# Patient Record
Sex: Male | Born: 1950 | Race: White | Hispanic: No | Marital: Married | Smoking: Never smoker
Health system: Southern US, Community
[De-identification: ages and names within clinical notes are randomized; demographics above are authoritative.]

## PROBLEM LIST (undated history)

## (undated) DIAGNOSIS — G20A1 Parkinson's disease without dyskinesia, without mention of fluctuations: Secondary | ICD-10-CM

## (undated) DIAGNOSIS — J157 Pneumonia due to Mycoplasma pneumoniae: Secondary | ICD-10-CM

## (undated) DIAGNOSIS — K219 Gastro-esophageal reflux disease without esophagitis: Secondary | ICD-10-CM

## (undated) DIAGNOSIS — G20 Parkinson's disease: Secondary | ICD-10-CM

## (undated) DIAGNOSIS — C44319 Basal cell carcinoma of skin of other parts of face: Secondary | ICD-10-CM

## (undated) DIAGNOSIS — M539 Dorsopathy, unspecified: Secondary | ICD-10-CM

## (undated) DIAGNOSIS — U071 COVID-19: Secondary | ICD-10-CM

## (undated) DIAGNOSIS — I209 Angina pectoris, unspecified: Secondary | ICD-10-CM

## (undated) HISTORY — PX: TONSILLECTOMY: SUR1361

## (undated) HISTORY — PX: SHOULDER ARTHROSCOPY: SHX128

---
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
## 2007-02-09 ENCOUNTER — Ambulatory Visit: Payer: Self-pay | Admitting: Gastroenterology

## 2013-01-24 DIAGNOSIS — C44319 Basal cell carcinoma of skin of other parts of face: Secondary | ICD-10-CM

## 2013-01-24 HISTORY — DX: Basal cell carcinoma of skin of other parts of face: C44.319

## 2013-04-16 ENCOUNTER — Ambulatory Visit: Payer: Self-pay | Admitting: Unknown Physician Specialty

## 2013-04-16 LAB — CREATININE, SERUM
Creatinine: 1.12 mg/dL (ref 0.60–1.30)
EGFR (African American): >60

## 2013-07-31 ENCOUNTER — Ambulatory Visit: Payer: Self-pay | Admitting: Orthopedic Surgery

## 2013-08-12 ENCOUNTER — Other Ambulatory Visit: Payer: Self-pay | Admitting: Nurse Practitioner

## 2014-02-25 DIAGNOSIS — E785 Hyperlipidemia, unspecified: Secondary | ICD-10-CM | POA: Insufficient documentation

## 2014-04-25 DIAGNOSIS — M7542 Impingement syndrome of left shoulder: Secondary | ICD-10-CM | POA: Insufficient documentation

## 2014-08-23 ENCOUNTER — Observation Stay (HOSPITAL_COMMUNITY)
Admission: EM | Admit: 2014-08-23 | Discharge: 2014-08-24 | Disposition: A | Discharge status: Home / Self Care | Payer: BC Managed Care – PPO | Attending: Internal Medicine | Admitting: Internal Medicine

## 2014-08-23 ENCOUNTER — Emergency Department (HOSPITAL_COMMUNITY): Payer: BC Managed Care – PPO

## 2014-08-23 ENCOUNTER — Encounter (HOSPITAL_COMMUNITY): Payer: Self-pay

## 2014-08-23 DIAGNOSIS — Z7982 Long term (current) use of aspirin: Secondary | ICD-10-CM | POA: Insufficient documentation

## 2014-08-23 DIAGNOSIS — J9811 Atelectasis: Secondary | ICD-10-CM | POA: Diagnosis not present

## 2014-08-23 DIAGNOSIS — G8918 Other acute postprocedural pain: Secondary | ICD-10-CM

## 2014-08-23 DIAGNOSIS — R5082 Postprocedural fever: Secondary | ICD-10-CM | POA: Diagnosis not present

## 2014-08-23 DIAGNOSIS — K219 Gastro-esophageal reflux disease without esophagitis: Secondary | ICD-10-CM | POA: Diagnosis present

## 2014-08-23 DIAGNOSIS — M25512 Pain in left shoulder: Secondary | ICD-10-CM | POA: Insufficient documentation

## 2014-08-23 DIAGNOSIS — R509 Fever, unspecified: Secondary | ICD-10-CM

## 2014-08-23 DIAGNOSIS — E871 Hypo-osmolality and hyponatremia: Secondary | ICD-10-CM | POA: Diagnosis present

## 2014-08-23 DIAGNOSIS — R0602 Shortness of breath: Secondary | ICD-10-CM

## 2014-08-23 DIAGNOSIS — K5900 Constipation, unspecified: Secondary | ICD-10-CM | POA: Diagnosis not present

## 2014-08-23 DIAGNOSIS — R079 Chest pain, unspecified: Secondary | ICD-10-CM | POA: Insufficient documentation

## 2014-08-23 DIAGNOSIS — J90 Pleural effusion, not elsewhere classified: Secondary | ICD-10-CM

## 2014-08-23 DIAGNOSIS — Z9889 Other specified postprocedural states: Secondary | ICD-10-CM

## 2014-08-23 LAB — COMPREHENSIVE METABOLIC PANEL
ALT: 16 U/L (ref 0–53)
AST: 20 U/L (ref 0–37)
Albumin: 3.5 g/dL (ref 3.5–5.2)
Alkaline Phosphatase: 76 U/L (ref 39–117)
Anion gap: 16 — ABNORMAL HIGH (ref 5–15)
BUN: 12 mg/dL (ref 6–23)
CHLORIDE: 88 meq/L — AB (ref 96–112)
CO2: 23 meq/L (ref 19–32)
CREATININE: 0.99 mg/dL (ref 0.50–1.35)
Calcium: 8.9 mg/dL (ref 8.4–10.5)
GFR calc Af Amer: >90 mL/min (ref >90)
GFR, EST NON AFRICAN AMERICAN: 85 mL/min — AB (ref >90)
Glucose, Bld: 135 mg/dL — ABNORMAL HIGH (ref 70–99)
Potassium: 3.7 mEq/L (ref 3.7–5.3)
Sodium: 127 mEq/L — ABNORMAL LOW (ref 137–147)
Total Bilirubin: 0.9 mg/dL (ref 0.3–1.2)
Total Protein: 6.8 g/dL (ref 6.0–8.3)

## 2014-08-23 LAB — CBC WITH DIFFERENTIAL/PLATELET
BASOS ABS: 0 10*3/uL (ref 0.0–0.1)
Basophils Relative: 0 % (ref 0–1)
Eosinophils Absolute: 0 10*3/uL (ref 0.0–0.7)
Eosinophils Relative: 0 % (ref 0–5)
HEMATOCRIT: 42.5 % (ref 39.0–52.0)
HEMOGLOBIN: 14.7 g/dL (ref 13.0–17.0)
Lymphocytes Relative: 6 % — ABNORMAL LOW (ref 12–46)
Lymphs Abs: 0.7 10*3/uL (ref 0.7–4.0)
MCH: 31.7 pg (ref 26.0–34.0)
MCHC: 34.6 g/dL (ref 30.0–36.0)
MCV: 91.8 fL (ref 78.0–100.0)
MONO ABS: 0.9 10*3/uL (ref 0.1–1.0)
MONOS PCT: 7 % (ref 3–12)
NEUTROS ABS: 11.1 10*3/uL — AB (ref 1.7–7.7)
Neutrophils Relative %: 87 % — ABNORMAL HIGH (ref 43–77)
Platelets: 250 10*3/uL (ref 150–400)
RBC: 4.63 MIL/uL (ref 4.22–5.81)
RDW: 13.3 % (ref 11.5–15.5)
WBC: 12.8 10*3/uL — AB (ref 4.0–10.5)

## 2014-08-23 LAB — I-STAT TROPONIN, ED: Troponin i, poc: 0.01 ng/mL (ref 0.00–0.08)

## 2014-08-23 MED ORDER — SODIUM CHLORIDE 0.9 % IV SOLN
INTRAVENOUS | Status: DC
  Administered 2014-08-24: 01:00:00 via INTRAVENOUS

## 2014-08-23 NOTE — ED Notes (Addendum)
Pt. Had left shoulder surgery x2 days ago. Reports increase in pain with chills and fevers up to 103. Numbness to fingers. Radial pulse intact. Pt. Also reports increased SOB.

## 2014-08-24 ENCOUNTER — Encounter (HOSPITAL_COMMUNITY): Payer: Self-pay

## 2014-08-24 ENCOUNTER — Emergency Department (HOSPITAL_COMMUNITY): Payer: BC Managed Care – PPO

## 2014-08-24 DIAGNOSIS — Z9889 Other specified postprocedural states: Secondary | ICD-10-CM

## 2014-08-24 DIAGNOSIS — E871 Hypo-osmolality and hyponatremia: Secondary | ICD-10-CM | POA: Diagnosis present

## 2014-08-24 DIAGNOSIS — J9811 Atelectasis: Secondary | ICD-10-CM | POA: Diagnosis present

## 2014-08-24 DIAGNOSIS — R079 Chest pain, unspecified: Secondary | ICD-10-CM | POA: Diagnosis present

## 2014-08-24 DIAGNOSIS — R5082 Postprocedural fever: Secondary | ICD-10-CM | POA: Diagnosis present

## 2014-08-24 DIAGNOSIS — K219 Gastro-esophageal reflux disease without esophagitis: Secondary | ICD-10-CM

## 2014-08-24 DIAGNOSIS — K5900 Constipation, unspecified: Secondary | ICD-10-CM | POA: Diagnosis present

## 2014-08-24 LAB — URINALYSIS, ROUTINE W REFLEX MICROSCOPIC
Bilirubin Urine: NEGATIVE
GLUCOSE, UA: NEGATIVE mg/dL
HGB URINE DIPSTICK: NEGATIVE
Ketones, ur: NEGATIVE mg/dL
Leukocytes, UA: NEGATIVE
Nitrite: NEGATIVE
PROTEIN: NEGATIVE mg/dL
Specific Gravity, Urine: 1.008 (ref 1.005–1.030)
Urobilinogen, UA: 0.2 mg/dL (ref 0.0–1.0)
pH: 8.5 — ABNORMAL HIGH (ref 5.0–8.0)

## 2014-08-24 LAB — COMPREHENSIVE METABOLIC PANEL
ALK PHOS: 63 U/L (ref 39–117)
ALT: 15 U/L (ref 0–53)
AST: 17 U/L (ref 0–37)
Albumin: 3 g/dL — ABNORMAL LOW (ref 3.5–5.2)
Anion gap: 15 (ref 5–15)
BUN: 11 mg/dL (ref 6–23)
CO2: 25 mEq/L (ref 19–32)
Calcium: 8.6 mg/dL (ref 8.4–10.5)
Chloride: 94 mEq/L — ABNORMAL LOW (ref 96–112)
Creatinine, Ser: 0.9 mg/dL (ref 0.50–1.35)
GFR calc Af Amer: >90 mL/min (ref >90)
GFR, EST NON AFRICAN AMERICAN: 88 mL/min — AB (ref >90)
Glucose, Bld: 118 mg/dL — ABNORMAL HIGH (ref 70–99)
POTASSIUM: 3.8 meq/L (ref 3.7–5.3)
SODIUM: 134 meq/L — AB (ref 137–147)
Total Bilirubin: 0.7 mg/dL (ref 0.3–1.2)
Total Protein: 5.9 g/dL — ABNORMAL LOW (ref 6.0–8.3)

## 2014-08-24 LAB — CBC WITH DIFFERENTIAL/PLATELET
BASOS ABS: 0 10*3/uL (ref 0.0–0.1)
Basophils Relative: 0 % (ref 0–1)
Eosinophils Absolute: 0 10*3/uL (ref 0.0–0.7)
Eosinophils Relative: 1 % (ref 0–5)
HCT: 39.4 % (ref 39.0–52.0)
Hemoglobin: 13.6 g/dL (ref 13.0–17.0)
LYMPHS ABS: 1.7 10*3/uL (ref 0.7–4.0)
LYMPHS PCT: 22 % (ref 12–46)
MCH: 31.8 pg (ref 26.0–34.0)
MCHC: 34.5 g/dL (ref 30.0–36.0)
MCV: 92.1 fL (ref 78.0–100.0)
Monocytes Absolute: 0.8 10*3/uL (ref 0.1–1.0)
Monocytes Relative: 10 % (ref 3–12)
NEUTROS ABS: 5 10*3/uL (ref 1.7–7.7)
NEUTROS PCT: 67 % (ref 43–77)
Platelets: 227 10*3/uL (ref 150–400)
RBC: 4.28 MIL/uL (ref 4.22–5.81)
RDW: 13.4 % (ref 11.5–15.5)
WBC: 7.5 10*3/uL (ref 4.0–10.5)

## 2014-08-24 LAB — LACTIC ACID, PLASMA: Lactic Acid, Venous: 1.1 mmol/L (ref 0.5–2.2)

## 2014-08-24 LAB — TSH: TSH: 5.78 u[IU]/mL — ABNORMAL HIGH (ref 0.350–4.500)

## 2014-08-24 LAB — TROPONIN I: Troponin I: <0.3 ng/mL (ref <0.30)

## 2014-08-24 MED ORDER — ONDANSETRON HCL 4 MG/2ML IJ SOLN: 4.0000 mg | Freq: Four times a day (QID) | INTRAMUSCULAR | Status: DC | PRN

## 2014-08-24 MED ORDER — OXYCODONE-ACETAMINOPHEN 5-325 MG PO TABS
1.0000 | ORAL_TABLET | ORAL | Status: DC | PRN
  Administered 2014-08-24 (×2): 2 via ORAL
  Filled 2014-08-24 (×2): qty 2

## 2014-08-24 MED ORDER — HYDROMORPHONE HCL 1 MG/ML IJ SOLN
1.0000 mg | INTRAMUSCULAR | Status: DC | PRN
Start: 2014-08-24 — End: 2014-08-24
  Administered 2014-08-24: 1 mg via INTRAVENOUS
  Filled 2014-08-24: qty 1

## 2014-08-24 MED ORDER — DOCUSATE SODIUM 100 MG PO CAPS
200.0000 mg | ORAL_CAPSULE | Freq: Two times a day (BID) | ORAL | Status: DC
  Administered 2014-08-24: 200 mg via ORAL
  Filled 2014-08-24: qty 2

## 2014-08-24 MED ORDER — SODIUM CHLORIDE 0.9 % IV SOLN: INTRAVENOUS | Status: DC

## 2014-08-24 MED ORDER — ACETAMINOPHEN 650 MG RE SUPP: 650.0000 mg | Freq: Four times a day (QID) | RECTAL | Status: DC | PRN

## 2014-08-24 MED ORDER — ACETAMINOPHEN 325 MG PO TABS: 650.0000 mg | ORAL_TABLET | Freq: Four times a day (QID) | ORAL | Status: DC | PRN

## 2014-08-24 MED ORDER — ASPIRIN EC 81 MG PO TBEC
81.0000 mg | DELAYED_RELEASE_TABLET | Freq: Every day | ORAL | Status: DC
  Administered 2014-08-24: 81 mg via ORAL
  Filled 2014-08-24: qty 1

## 2014-08-24 MED ORDER — HYDROMORPHONE HCL 1 MG/ML IJ SOLN: 0.5000 mg | INTRAMUSCULAR | Status: DC | PRN

## 2014-08-24 MED ORDER — ONDANSETRON HCL 4 MG PO TABS: 4.0000 mg | ORAL_TABLET | Freq: Four times a day (QID) | ORAL | Status: DC | PRN

## 2014-08-24 MED ORDER — IOHEXOL 350 MG/ML SOLN
100.0000 mL | Freq: Once | INTRAVENOUS | Status: AC | PRN
  Administered 2014-08-24: 100 mL via INTRAVENOUS

## 2014-08-24 MED ORDER — SODIUM CHLORIDE 0.9 % IJ SOLN
3.0000 mL | Freq: Two times a day (BID) | INTRAMUSCULAR | Status: DC
Start: 2014-08-24 — End: 2014-08-24
  Administered 2014-08-24: 3 mL via INTRAVENOUS

## 2014-08-24 MED ORDER — OXYCODONE-ACETAMINOPHEN 5-325 MG PO TABS
1.0000 | ORAL_TABLET | ORAL | Status: DC | PRN
Start: 2014-08-24 — End: 2016-05-15

## 2014-08-24 MED ORDER — SODIUM CHLORIDE 0.9 % IV SOLN
INTRAVENOUS | Status: DC
  Administered 2014-08-24: 07:00:00 via INTRAVENOUS

## 2014-08-24 MED ORDER — FENTANYL CITRATE 0.05 MG/ML IJ SOLN
50.0000 ug | INTRAMUSCULAR | Status: DC | PRN
  Administered 2014-08-24: 50 ug via INTRAVENOUS
  Filled 2014-08-24 (×2): qty 2

## 2014-08-24 MED ORDER — OXYCODONE HCL ER 10 MG PO T12A: 20.0000 mg | EXTENDED_RELEASE_TABLET | Freq: Two times a day (BID) | ORAL | Status: DC | PRN

## 2014-08-24 MED ORDER — PANTOPRAZOLE SODIUM 40 MG PO TBEC
40.0000 mg | DELAYED_RELEASE_TABLET | Freq: Every day | ORAL | Status: DC
  Administered 2014-08-24: 40 mg via ORAL
  Filled 2014-08-24: qty 1

## 2014-08-24 MED ORDER — ENOXAPARIN SODIUM 40 MG/0.4ML ~~LOC~~ SOLN
40.0000 mg | Freq: Every day | SUBCUTANEOUS | Status: DC
Start: 2014-08-24 — End: 2014-08-24
  Administered 2014-08-24: 40 mg via SUBCUTANEOUS
  Filled 2014-08-24: qty 0.4

## 2014-08-24 NOTE — ED Provider Notes (Signed)
CSN: [id number]     Arrival date & time 08/23/14  2158 History   First MD Initiated Contact with Patient 08/23/14 2304     Chief Complaint  Patient presents with  . Post-op Problem      HPI  Pt was seen at 2340. Per pt and his family, c/o gradual onset and worsening of persistent home fevers to "103" for the past 2 days. Pt states his fevers started s/p left shoulder arthroscopic surgery 2 days ago. Pt also c/o increasing and uncontrolled pain in his left shoulder, as well as left sided CP and SOB. Denies palpitations, no cough, no abd pain, no N/V/D, no back pain, no rash, no injury.    [facility] Dr. Vonna Drafts [facility], [place]) History reviewed. No pertinent past medical history.   Past Surgical History  Procedure Laterality Date  . Shoulder arthroscopy    . Tonsillectomy      History  Substance Use Topics  . Smoking status: Never Smoker   . Smokeless tobacco: Not on file  . Alcohol Use: Yes     Comment: occasionally    Review of Systems ROS: Statement: All systems negative except as marked or noted in the HPI; Constitutional: +fever and chills. ; ; Eyes: Negative for eye pain, redness and discharge. ; ; ENMT: Negative for ear pain, hoarseness, nasal congestion, sinus pressure and sore throat. ; ; Cardiovascular: +CP, SOB. Negative for palpitations, diaphoresis, and peripheral edema. ; ; Respiratory: Negative for cough, wheezing and stridor. ; ; Gastrointestinal: Negative for nausea, vomiting, diarrhea, abdominal pain, blood in stool, hematemesis, jaundice and rectal bleeding. . ; ; Genitourinary: Negative for dysuria, flank pain and hematuria. ; ; Musculoskeletal: +shoulder pain. Negative for back pain and neck pain. Negative for swelling and trauma.; ; Skin: Negative for pruritus, rash, abrasions, blisters, bruising and skin lesion.; ; Neuro: Negative for headache, lightheadedness and neck stiffness. Negative for weakness, altered level of consciousness , altered mental status,  extremity weakness, paresthesias, involuntary movement, seizure and syncope.        Allergies  Review of patient's allergies indicates no known allergies.  Home Medications   Prior to Admission medications   Medication Sig Start Date End Date Taking? Authorizing Provider  aspirin EC 81 MG tablet Take 81 mg by mouth daily.   Yes Historical Provider, MD  azelastine (ASTELIN) 0.1 % nasal spray Place 1-2 sprays into both nostrils daily as needed for rhinitis or allergies. Use in each nostril as directed   Yes Historical Provider, MD  azelastine (OPTIVAR) 0.05 % ophthalmic solution Place 1 drop into both eyes daily as needed (allergy).   Yes Historical Provider, MD  bisacodyl (DULCOLAX) 5 MG EC tablet Take 15 mg by mouth daily as needed for moderate constipation.   Yes Historical Provider, MD  docusate sodium (COLACE) 100 MG capsule Take 200 mg by mouth daily as needed for mild constipation.   Yes Historical Provider, MD  omeprazole (PRILOSEC) 40 MG capsule Take 40 mg by mouth daily.   Yes Historical Provider, MD  ondansetron (ZOFRAN) 4 MG tablet Take 4 mg by mouth every 6 (six) hours as needed for nausea or vomiting.   Yes Historical Provider, MD  OxyCODONE (OXYCONTIN) 20 mg T12A 12 hr tablet Take 20 mg by mouth every 12 (twelve) hours as needed (pain).   Yes Historical Provider, MD  OxyCODONE HCl, Abuse Deter, 5 MG TABA Take 1-3 tablets by mouth every 3 (three) hours as needed (pain).   Yes Historical Provider,  MD  TESTOSTERONE TD Place 1 application onto the skin daily.   Yes Historical Provider, MD  triamcinolone (NASACORT ALLERGY 24HR) 55 MCG/ACT AERO nasal inhaler Place 2 sprays into the nose daily as needed (allergies).   Yes Historical Provider, MD   BP 108/72 mmHg  Pulse 81  Temp(Src) 98.9 F (37.2 C) (Oral)  Resp 27  SpO2 94% Physical Exam  2345: Physical examination:  Nursing notes reviewed; Vital signs and O2 SAT reviewed;  Constitutional: Well developed, Well nourished, Well  hydrated, In no acute distress; Head:  Normocephalic, atraumatic; Eyes: EOMI, PERRL, No scleral icterus; ENMT: Mouth and pharynx normal, Mucous membranes moist; Neck: Supple, Full range of motion, No lymphadenopathy; Cardiovascular: Regular rate and rhythm, No gallop; Respiratory: Breath sounds coarse & equal bilaterally, No wheezes.  Speaking full sentences with ease, Normal respiratory effort/excursion; Chest: +left upper chest wall tender to palp, no erythema, no ecchymosis. Movement normal; Abdomen: Soft, Nontender, Nondistended, Normal bowel sounds; Genitourinary: No CVA tenderness; Extremities: Pulses normal, +left shoulder with generalized tenderness to palp. No erythema, no ecchymosis, no drainage from surgical wounds. Steri-strips in place, dry/intact. No deformity. Strong radial pulse, brisk cap refill in fingertips. No calf edema or asymmetry.; Neuro: AA&Ox3, Major CN grossly intact.  Speech clear. No gross focal motor or sensory deficits in extremities.; Skin: Color normal, Warm, Dry.   ED Course  Procedures    EKG Interpretation   Date/Time:  Saturday August 23 2014 22:36:10 EST Ventricular Rate:  95 PR Interval:  165 QRS Duration: 89 QT Interval:  339 QTC Calculation: 426 R Axis:   70 Text Interpretation:  Sinus rhythm Probable left atrial enlargement  Baseline wander No old tracing to compare Confirmed by [facility]  MD,  Nunzio Cory [id number]) on 08/23/2014 11:37:32 PM      MDM  MDM Reviewed: nursing note and vitals Interpretation: labs, x-ray, CT scan and ECG Total time providing critical care: 30-74 minutes. This excludes time spent performing separately reportable procedures and services. Consults: admitting MD   CRITICAL CARE Performed by: Alfonzo Feller Total critical care time: 35 Critical care time was exclusive of separately billable procedures and treating other patients. Critical care was necessary to treat or prevent imminent or life-threatening  deterioration. Critical care was time spent personally by me on the following activities: development of treatment plan with patient and/or surrogate as well as nursing, discussions with consultants, evaluation of patient's response to treatment, examination of patient, obtaining history from patient or surrogate, ordering and performing treatments and interventions, ordering and review of laboratory studies, ordering and review of radiographic studies, pulse oximetry and re-evaluation of patient's condition.    Results for orders placed or performed during the hospital encounter of 08/23/14  CBC WITH DIFFERENTIAL  Result Value Ref Range   WBC 12.8 (H) 4.0 - 10.5 K/uL   RBC 4.63 4.22 - 5.81 MIL/uL   Hemoglobin 14.7 13.0 - 17.0 g/dL   HCT 42.5 39.0 - 52.0 %   MCV 91.8 78.0 - 100.0 fL   MCH 31.7 26.0 - 34.0 pg   MCHC 34.6 30.0 - 36.0 g/dL   RDW 13.3 11.5 - 15.5 %   Platelets 250 150 - 400 K/uL   Neutrophils Relative % 87 (H) 43 - 77 %   Neutro Abs 11.1 (H) 1.7 - 7.7 K/uL   Lymphocytes Relative 6 (L) 12 - 46 %   Lymphs Abs 0.7 0.7 - 4.0 K/uL   Monocytes Relative 7 3 - 12 %   Monocytes Absolute 0.9  0.1 - 1.0 K/uL   Eosinophils Relative 0 0 - 5 %   Eosinophils Absolute 0.0 0.0 - 0.7 K/uL   Basophils Relative 0 0 - 1 %   Basophils Absolute 0.0 0.0 - 0.1 K/uL  Comprehensive metabolic panel  Result Value Ref Range   Sodium 127 (L) 137 - 147 mEq/L   Potassium 3.7 3.7 - 5.3 mEq/L   Chloride 88 (L) 96 - 112 mEq/L   CO2 23 19 - 32 mEq/L   Glucose, Bld 135 (H) 70 - 99 mg/dL   BUN 12 6 - 23 mg/dL   Creatinine, Ser 0.99 0.50 - 1.35 mg/dL   Calcium 8.9 8.4 - 10.5 mg/dL   Total Protein 6.8 6.0 - 8.3 g/dL   Albumin 3.5 3.5 - 5.2 g/dL   AST 20 0 - 37 U/L   ALT 16 0 - 53 U/L   Alkaline Phosphatase 76 39 - 117 U/L   Total Bilirubin 0.9 0.3 - 1.2 mg/dL   GFR calc non Af Amer 85 (L) >90 mL/min   GFR calc Af Amer >90 >90 mL/min   Anion gap 16 (H) 5 - 15  Urinalysis with microscopic  Result  Value Ref Range   Color, Urine YELLOW YELLOW   APPearance CLEAR CLEAR   Specific Gravity, Urine 1.008 1.005 - 1.030   pH 8.5 (H) 5.0 - 8.0   Glucose, UA NEGATIVE NEGATIVE mg/dL   Hgb urine dipstick NEGATIVE NEGATIVE   Bilirubin Urine NEGATIVE NEGATIVE   Ketones, ur NEGATIVE NEGATIVE mg/dL   Protein, ur NEGATIVE NEGATIVE mg/dL   Urobilinogen, UA 0.2 0.0 - 1.0 mg/dL   Nitrite NEGATIVE NEGATIVE   Leukocytes, UA NEGATIVE NEGATIVE  Lactic acid, plasma  Result Value Ref Range   Lactic Acid, Venous 1.1 0.5 - 2.2 mmol/L  I-stat troponin, ED (not at [facility])  Result Value Ref Range   Troponin i, poc 0.01 0.00 - 0.08 ng/mL   Comment 3           Dg Chest 2 View 08/23/2014   CLINICAL DATA:  Left shoulder surgery 2 days ago.  Chills, fevers  EXAM: CHEST  2 VIEW  COMPARISON:  None  FINDINGS: The heart size and mediastinal contours are within normal limits. Both lungs are clear. The visualized skeletal structures are unremarkable.  IMPRESSION: No active cardiopulmonary disease.   Electronically Signed   By: Kathreen Devoid   On: 08/23/2014 23:45   Ct Angio Chest Pe W/cm &/or Wo Cm 08/24/2014   CLINICAL DATA:  Left shoulder surgery 2 days ago. Left-sided chest pain. Fevers and chills  EXAM: CT ANGIOGRAPHY CHEST WITH CONTRAST  TECHNIQUE: Multidetector CT imaging of the chest was performed using the standard protocol during bolus administration of intravenous contrast. Multiplanar CT image reconstructions and MIPs were obtained to evaluate the vascular anatomy.  CONTRAST:  125mL OMNIPAQUE IOHEXOL 350 MG/ML SOLN  COMPARISON:  None.  FINDINGS: There is adequate opacification of the pulmonary arteries. There is no pulmonary embolus. The main pulmonary artery, right main pulmonary artery and left main pulmonary arteries are normal in size. The heart size is normal. There is no pericardial effusion. The thoracic aorta is normal in caliber. There is no thoracic aortic dissection.  There is a small left pleural effusion.  There is bibasilar atelectasis, left greater than right.  There is no axillary, hilar, or mediastinal adenopathy.  There is no lytic or blastic osseous lesion. There is a small amount of air deep to the left pectoralis  muscle which may be related to recent surgery. There is mild skin thickening of the left anterior chest wall with haziness in the subcutaneous fat.  The visualized portions of the upper abdomen are unremarkable.  Review of the MIP images confirms the above findings.  IMPRESSION: 1. No evidence of pulmonary embolus. 2. Small left pleural effusion. Bibasilar atelectasis, left greater than right. 3. There is a small amount of air deep to the left pectoralis muscle which may be related to recent surgery. There is mild skin thickening of the left anterior chest wall with haziness in the subcutaneous fat which also may be related to recent surgery. Correlate with physical exam.   Electronically Signed   By: Kathreen Devoid   On: 08/24/2014 02:34   Dg Shoulder Left 08/23/2014   CLINICAL DATA:  Postoperative pain, initial evaluation  EXAM: LEFT SHOULDER - 2+ VIEW  COMPARISON:  None.  FINDINGS: AP and Y-views submitted. There is no evidence of glenohumeral fracture or dislocation. No significant degenerative change.  IMPRESSION: No acute findings.   Electronically Signed   By: Skipper Cliche M.D.   On: 08/23/2014 23:46    0305: No PE on CT scan. Pt encouraged to take deep breaths given atelectasis on CXR. O2 Sats decreased to 90% R/A, increased to 95% on O2 2L N/C. Judicious IVF given for hyponatremia. Dx and testing d/w pt and family.  Questions answered.  Verb understanding, agreeable to admit. T/C to [facility] Dr. Gaye Alken, case discussed, including:  HPI, pertinent PM/SHx, VS/PE, dx testing, ED course and treatment:  No further testing to request regarding pt's shoulder surgery, highly doubtful to have surgical site infection this soon after operative intervention. T/C to [facility] Dr. Posey Pronto, case  discussed, including:  HPI, pertinent PM/SHx, VS/PE, dx testing, ED course and treatment:  Agreeable to admit, requests to write temporary orders, obtain observation tele bed to team MCAdmits.   Francine Graven, DO 08/25/14 [phone]

## 2014-08-24 NOTE — ED Notes (Signed)
Dr. Posey Pronto at bedside. (admitting dr)

## 2014-08-24 NOTE — Discharge Summary (Signed)
Physician Discharge Summary  Dale [name] WIO:[id number] DOB: [birth year] DOA: 08/23/2014  PCP: No primary care provider on file.  Admit date: 08/23/2014 Discharge date: 08/24/2014  Time spent: 20 minutes  Recommendations for Outpatient Follow-up:  1. contiue Percocet for pain 2. See Dr. Redmond Pulling ASAP    Discharge Diagnoses:  Principal Problem:   Hyponatremia Active Problems:   S/P rotator cuff surgery   GERD (gastroesophageal reflux disease)   Postprocedural fever   Atelectasis   Chest pain   Constipation   Discharge Condition: good  Diet recommendation: heart healthy  Filed Weights   08/24/14 0602  Weight: 83.1 kg (183 lb 3.2 oz)    History of present illness:  63 yr old male admitted earlier 11/14 with Severe pain in L shouldr after having Shoulder surgery 08/21/14.  OxyContin and other eds not helping was sweaty and tachycardic and initial thought was ? Sepsis.  Patient's symptoms rapidly improved, with use of Percocet.  NO source of fever on CXR/UA Patient was recommended to stay in-patient but decided to d/c home and hence given limied supply percocet and instructions to follow closely with Orthopedic surgeon [place]    Discharge Exam: Filed Vitals:   08/24/14 0602  BP: 112/64  Pulse: 66  Temp: 98.3 F (36.8 C)  Resp: 21    General: alert pleasant Cardiovascular:  s1 s 2no m/r/g Respiratory: clear  Discharge Instructions You were cared for by a hospitalist during your hospital stay. If you have any questions about your discharge medications or the care you received while you were in the hospital after you are discharged, you can call the unit and asked to speak with the hospitalist on call if the hospitalist that took care of you is not available. Once you are discharged, your primary care physician will handle any further medical issues. Please note that NO REFILLS for any discharge medications will be authorized once you are discharged, as it is  imperative that you return to your primary care physician (or establish a relationship with a primary care physician if you do not have one) for your aftercare needs so that they can reassess your need for medications and monitor your lab values.  Discharge Instructions    Diet - low sodium heart healthy    Complete by:  As directed      Increase activity slowly    Complete by:  As directed           Current Discharge Medication List    START taking these medications   Details  oxyCODONE-acetaminophen (PERCOCET/ROXICET) 5-325 MG per tablet Take 1-2 tablets by mouth every 4 (four) hours as needed for moderate pain. Qty: 30 tablet, Refills: 0      CONTINUE these medications which have NOT CHANGED   Details  aspirin EC 81 MG tablet Take 81 mg by mouth daily.    azelastine (ASTELIN) 0.1 % nasal spray Place 1-2 sprays into both nostrils daily as needed for rhinitis or allergies. Use in each nostril as directed    azelastine (OPTIVAR) 0.05 % ophthalmic solution Place 1 drop into both eyes daily as needed (allergy).    bisacodyl (DULCOLAX) 5 MG EC tablet Take 15 mg by mouth daily as needed for moderate constipation.    docusate sodium (COLACE) 100 MG capsule Take 200 mg by mouth daily as needed for mild constipation.    omeprazole (PRILOSEC) 40 MG capsule Take 40 mg by mouth daily.    ondansetron (ZOFRAN) 4 MG tablet Take 4  mg by mouth every 6 (six) hours as needed for nausea or vomiting.    OxyCODONE (OXYCONTIN) 20 mg T12A 12 hr tablet Take 20 mg by mouth every 12 (twelve) hours as needed (pain).    TESTOSTERONE TD Place 1 application onto the skin daily.    triamcinolone (NASACORT ALLERGY 24HR) 55 MCG/ACT AERO nasal inhaler Place 2 sprays into the nose daily as needed (allergies).      STOP taking these medications     OxyCODONE HCl, Abuse Deter, 5 MG TABA        No Known Allergies    The results of significant diagnostics from this hospitalization (including imaging,  microbiology, ancillary and laboratory) are listed below for reference.    Significant Diagnostic Studies: Dg Chest 2 View  08/23/2014   CLINICAL DATA:  Left shoulder surgery 2 days ago.  Chills, fevers  EXAM: CHEST  2 VIEW  COMPARISON:  None  FINDINGS: The heart size and mediastinal contours are within normal limits. Both lungs are clear. The visualized skeletal structures are unremarkable.  IMPRESSION: No active cardiopulmonary disease.   Electronically Signed   By: Kathreen Devoid   On: 08/23/2014 23:45   Ct Angio Chest Pe W/cm &/or Wo Cm  08/24/2014   CLINICAL DATA:  Left shoulder surgery 2 days ago. Left-sided chest pain. Fevers and chills  EXAM: CT ANGIOGRAPHY CHEST WITH CONTRAST  TECHNIQUE: Multidetector CT imaging of the chest was performed using the standard protocol during bolus administration of intravenous contrast. Multiplanar CT image reconstructions and MIPs were obtained to evaluate the vascular anatomy.  CONTRAST:  150mL OMNIPAQUE IOHEXOL 350 MG/ML SOLN  COMPARISON:  None.  FINDINGS: There is adequate opacification of the pulmonary arteries. There is no pulmonary embolus. The main pulmonary artery, right main pulmonary artery and left main pulmonary arteries are normal in size. The heart size is normal. There is no pericardial effusion. The thoracic aorta is normal in caliber. There is no thoracic aortic dissection.  There is a small left pleural effusion. There is bibasilar atelectasis, left greater than right.  There is no axillary, hilar, or mediastinal adenopathy.  There is no lytic or blastic osseous lesion. There is a small amount of air deep to the left pectoralis muscle which may be related to recent surgery. There is mild skin thickening of the left anterior chest wall with haziness in the subcutaneous fat.  The visualized portions of the upper abdomen are unremarkable.  Review of the MIP images confirms the above findings.  IMPRESSION: 1. No evidence of pulmonary embolus. 2. Small  left pleural effusion. Bibasilar atelectasis, left greater than right. 3. There is a small amount of air deep to the left pectoralis muscle which may be related to recent surgery. There is mild skin thickening of the left anterior chest wall with haziness in the subcutaneous fat which also may be related to recent surgery. Correlate with physical exam.   Electronically Signed   By: Kathreen Devoid   On: 08/24/2014 02:34   Dg Shoulder Left  08/23/2014   CLINICAL DATA:  Postoperative pain, initial evaluation  EXAM: LEFT SHOULDER - 2+ VIEW  COMPARISON:  None.  FINDINGS: AP and Y-views submitted. There is no evidence of glenohumeral fracture or dislocation. No significant degenerative change.  IMPRESSION: No acute findings.   Electronically Signed   By: Skipper Cliche M.D.   On: 08/23/2014 23:46    Microbiology: No results found for this or any previous visit (from the past 240 hour(s)).  Labs: Basic Metabolic Panel:  Recent Labs Lab 08/23/14 2230 08/24/14 0457  NA 127* 134*  K 3.7 3.8  CL 88* 94*  CO2 23 25  GLUCOSE 135* 118*  BUN 12 11  CREATININE 0.99 0.90  CALCIUM 8.9 8.6   Liver Function Tests:  Recent Labs Lab 08/23/14 2230 08/24/14 0457  AST 20 17  ALT 16 15  ALKPHOS 76 63  BILITOT 0.9 0.7  PROT 6.8 5.9*  ALBUMIN 3.5 3.0*   No results for input(s): LIPASE, AMYLASE in the last 168 hours. No results for input(s): AMMONIA in the last 168 hours. CBC:  Recent Labs Lab 08/23/14 2230 08/24/14 0457  WBC 12.8* 7.5  NEUTROABS 11.1* 5.0  HGB 14.7 13.6  HCT 42.5 39.4  MCV 91.8 92.1  PLT 250 227   Cardiac Enzymes:  Recent Labs Lab 08/24/14 0457 08/24/14 1214  TROPONINI <0.30 <0.30   BNP: BNP (last 3 results) No results for input(s): PROBNP in the last 8760 hours. CBG: No results for input(s): GLUCAP in the last 168 hours.     SignedNita Sells  [facility] 08/24/2014, 1:21 PM

## 2014-08-24 NOTE — ED Notes (Signed)
Per Herbie Baltimore, EMT pt is having left sided chest pain.

## 2014-08-24 NOTE — H&P (Addendum)
[facility] History and Physical  Patient: Dale [name]  RKY:[id number]  DOB: [birth year]  DOS: the patient was seen and examined on 08/24/2014 PCP: No primary care provider on file.  Chief Complaint: fEVER  HPI: Dale [name] is a 63 y.o. male with Past medical history of left SHOULDER SURGERY. The patient is presenting with complaints of fever and severe pain on his left shoulder and chest. He mentions that on Thursday he has undergone left shoulder arthroscopic rotator cuff repair with [facility]. He usually follows up at [facility] with his PCP at [facility]. He was sent here by his orthopedics as the patient was having severe pain on his left shoulder. This was followed by complaining of severe pain on the left chest which was felt as tightness associated with shortness of breath. He also started having complaints of fever and chills and had some cough but no sputum production. He denied any dizziness or lightheadedness. He tried to use oxycodone and OxyContin that was provided to him by orthopedics but that did not help his pain and therefore he decided to come to the hospital. He denies any diarrhea or burning urination. Denies any rash anywhere. He denies any similar symptoms in the past. 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 He does not have any bowel movement since Thursday. He has been having decreased appetite due to severe pain since Thursday.  The patient is coming from home. And at his baseline independent for most of his ADL.  Review of Systems: as mentioned in the history of present illness.  A Comprehensive review of the other systems is negative.  History reviewed. No pertinent past medical history. Past Surgical History  Procedure Laterality Date  . Shoulder arthroscopy    . Tonsillectomy     Social History:  reports that he has never smoked. He does not have any smokeless tobacco history on file. He reports that he drinks  alcohol. He reports that he does not use illicit drugs.  No Known Allergies  History reviewed. No pertinent family history.  Prior to Admission medications   Medication Sig Start Date End Date Taking? Authorizing Provider  aspirin EC 81 MG tablet Take 81 mg by mouth daily.   Yes Historical Provider, MD  azelastine (ASTELIN) 0.1 % nasal spray Place 1-2 sprays into both nostrils daily as needed for rhinitis or allergies. Use in each nostril as directed   Yes Historical Provider, MD  azelastine (OPTIVAR) 0.05 % ophthalmic solution Place 1 drop into both eyes daily as needed (allergy).   Yes Historical Provider, MD  bisacodyl (DULCOLAX) 5 MG EC tablet Take 15 mg by mouth daily as needed for moderate constipation.   Yes Historical Provider, MD  docusate sodium (COLACE) 100 MG capsule Take 200 mg by mouth daily as needed for mild constipation.   Yes Historical Provider, MD  omeprazole (PRILOSEC) 40 MG capsule Take 40 mg by mouth daily.   Yes Historical Provider, MD  ondansetron (ZOFRAN) 4 MG tablet Take 4 mg by mouth every 6 (six) hours as needed for nausea or vomiting.   Yes Historical Provider, MD  OxyCODONE (OXYCONTIN) 20 mg T12A 12 hr tablet Take 20 mg by mouth every 12 (twelve) hours as needed (pain).   Yes Historical Provider, MD  OxyCODONE HCl, Abuse Deter, 5 MG TABA Take 1-3 tablets by mouth every 3 (three) hours as needed (pain).   Yes Historical Provider, MD  TESTOSTERONE TD Place 1 application onto the skin  daily.   Yes Historical Provider, MD  triamcinolone (NASACORT ALLERGY 24HR) 55 MCG/ACT AERO nasal inhaler Place 2 sprays into the nose daily as needed (allergies).   Yes Historical Provider, MD    Physical Exam: Filed Vitals:   08/24/14 0500 08/24/14 0515 08/24/14 0530 08/24/14 0602  BP: 109/69 110/79 115/94 112/64  Pulse: 63 70 69 66  Temp:    98.3 F (36.8 C)  TempSrc:    Oral  Resp:    21  Height:    5\' 1"  (1.549 m)  Weight:    83.1 kg (183 lb 3.2 oz)  SpO2: 96% 90% 100%  97%    General: Alert, Awake and Oriented to Time, Place and Person. Appear in mild distress Eyes: PERRL ENT: Oral Mucosa clear moist. Neck: no JVD Cardiovascular: S1 and S2 Present, no Murmur, Peripheral Pulses Present Respiratory: Bilateral Air entry equal and Decreased, Clear to Auscultation, noCrackles, no wheezes Abdomen: Bowel Sound present, Soft and non tender Skin: no Rash Extremities: shoulder incision appears well-healing No crepitus on the chest No Pedal edema, no calf tenderness Neurologic: Grossly no focal neuro deficit.  Labs on Admission:  CBC:  Recent Labs Lab 08/23/14 2230 08/24/14 0457  WBC 12.8* 7.5  NEUTROABS 11.1* 5.0  HGB 14.7 13.6  HCT 42.5 39.4  MCV 91.8 92.1  PLT 250 227    CMP     Component Value Date/Time   NA 134* 08/24/2014 0457   K 3.8 08/24/2014 0457   CL 94* 08/24/2014 0457   CO2 25 08/24/2014 0457   GLUCOSE 118* 08/24/2014 0457   BUN 11 08/24/2014 0457   CREATININE 0.90 08/24/2014 0457   CALCIUM 8.6 08/24/2014 0457   PROT 5.9* 08/24/2014 0457   ALBUMIN 3.0* 08/24/2014 0457   AST 17 08/24/2014 0457   ALT 15 08/24/2014 0457   ALKPHOS 63 08/24/2014 0457   BILITOT 0.7 08/24/2014 0457   GFRNONAA 88* 08/24/2014 0457   GFRAA >90 08/24/2014 0457    No results for input(s): LIPASE, AMYLASE in the last 168 hours. No results for input(s): AMMONIA in the last 168 hours.  No results for input(s): CKTOTAL, CKMB, CKMBINDEX, TROPONINI in the last 168 hours. BNP (last 3 results) No results for input(s): PROBNP in the last 8760 hours.  Radiological Exams on Admission: Dg Chest 2 View  08/23/2014   CLINICAL DATA:  Left shoulder surgery 2 days ago.  Chills, fevers  EXAM: CHEST  2 VIEW  COMPARISON:  None  FINDINGS: The heart size and mediastinal contours are within normal limits. Both lungs are clear. The visualized skeletal structures are unremarkable.  IMPRESSION: No active cardiopulmonary disease.   Electronically Signed   By: Kathreen Devoid    On: 08/23/2014 23:45   Ct Angio Chest Pe W/cm &/or Wo Cm  08/24/2014   CLINICAL DATA:  Left shoulder surgery 2 days ago. Left-sided chest pain. Fevers and chills  EXAM: CT ANGIOGRAPHY CHEST WITH CONTRAST  TECHNIQUE: Multidetector CT imaging of the chest was performed using the standard protocol during bolus administration of intravenous contrast. Multiplanar CT image reconstructions and MIPs were obtained to evaluate the vascular anatomy.  CONTRAST:  148mL OMNIPAQUE IOHEXOL 350 MG/ML SOLN  COMPARISON:  None.  FINDINGS: There is adequate opacification of the pulmonary arteries. There is no pulmonary embolus. The main pulmonary artery, right main pulmonary artery and left main pulmonary arteries are normal in size. The heart size is normal. There is no pericardial effusion. The thoracic aorta is normal in caliber.  There is no thoracic aortic dissection.  There is a small left pleural effusion. There is bibasilar atelectasis, left greater than right.  There is no axillary, hilar, or mediastinal adenopathy.  There is no lytic or blastic osseous lesion. There is a small amount of air deep to the left pectoralis muscle which may be related to recent surgery. There is mild skin thickening of the left anterior chest wall with haziness in the subcutaneous fat.  The visualized portions of the upper abdomen are unremarkable.  Review of the MIP images confirms the above findings.  IMPRESSION: 1. No evidence of pulmonary embolus. 2. Small left pleural effusion. Bibasilar atelectasis, left greater than right. 3. There is a small amount of air deep to the left pectoralis muscle which may be related to recent surgery. There is mild skin thickening of the left anterior chest wall with haziness in the subcutaneous fat which also may be related to recent surgery. Correlate with physical exam.   Electronically Signed   By: Kathreen Devoid   On: 08/24/2014 02:34   Dg Shoulder Left  08/23/2014   CLINICAL DATA:  Postoperative pain,  initial evaluation  EXAM: LEFT SHOULDER - 2+ VIEW  COMPARISON:  None.  FINDINGS: AP and Y-views submitted. There is no evidence of glenohumeral fracture or dislocation. No significant degenerative change.  IMPRESSION: No acute findings.   Electronically Signed   By: Skipper Cliche M.D.   On: 08/23/2014 23:46    EKG: Independently reviewed. there are no previous tracings available for comparison, normal sinus rhythm, nonspecific ST and T waves changes.  Assessment/Plan Principal Problem:   Hyponatremia Active Problems:   S/P rotator cuff surgery   GERD (gastroesophageal reflux disease)   Postprocedural fever   Atelectasis   Chest pain   Constipation   1. Hyponatremia The patient is presenting with complaints of Chest pain with decreased appetite. Further workup shows he has hyponatremia. Likely secondary to poor oral intake. At present I would continue with IV fluids and check TSH.  2.chest pain. Patient's chest pain was left-sided and felt like tightening. EKG and initial troponin is negative. We will consider troponins monitor him on telemetry. No prior history of cardiac disease. Continue close monitoring. CT of the chest is negative for any acute pulmonary embolism. CT chest shows that there is very small pocket of air on the left pectoral but no crepitus felt no pneumothorax. Possibly due to postoperative situation for the patient. Continue close monitoring.  3.status post rotator cuff surgery. Patient is in severe pain after the surgery. Continue his home pain medications as well as use of IV narcotics as needed. Bowel regimen will be provided.  4.fever. Likely secondary to atelectasis. Incentive spirometry will be provided. Continue close monitoring. CT scan and is well as urine are negative for any acute infection.  Advance goals of care discussion: full code   Consults: patient's primary orthopedics by ED physician on phone.  DVT Prophylaxis: subcutaneous  Heparin Nutrition: regular diet  Disposition: Admitted to inpatient in telemetry unit.  Author: Berle Mull, MD [facility] Pager: [phone] 08/24/2014, 6:33 AM    If 7PM-7AM, please contact night-coverage [email or web address] Password [email or web address]

## 2014-08-24 NOTE — Progress Notes (Signed)
Patient discharge teaching given, including activity, diet, follow-up appoints, and medications. Patient verbalized understanding of all discharge instructions. IV access was d/c'd. Vitals are stable. Skin is intact except as charted in most recent assessments. Pt to be escorted out by NT, to be driven home by family. 

## 2014-08-24 NOTE — Progress Notes (Signed)
Utilization Review Completed.   Delories Mauri, RN, BSN Nurse Case Manager  

## 2014-08-26 LAB — URINE CULTURE
COLONY COUNT: NO GROWTH
CULTURE: NO GROWTH

## 2014-10-10 DIAGNOSIS — G20A1 Parkinson's disease without dyskinesia, without mention of fluctuations: Secondary | ICD-10-CM | POA: Insufficient documentation

## 2014-10-10 DIAGNOSIS — G20 Parkinson's disease: Secondary | ICD-10-CM | POA: Insufficient documentation

## 2016-05-10 DIAGNOSIS — R030 Elevated blood-pressure reading, without diagnosis of hypertension: Secondary | ICD-10-CM | POA: Diagnosis present

## 2016-05-10 DIAGNOSIS — R42 Dizziness and giddiness: Secondary | ICD-10-CM | POA: Diagnosis not present

## 2016-05-10 DIAGNOSIS — R509 Fever, unspecified: Secondary | ICD-10-CM | POA: Diagnosis not present

## 2016-05-10 LAB — BASIC METABOLIC PANEL
ANION GAP: 9 (ref 5–15)
BUN: 15 mg/dL (ref 6–20)
CALCIUM: 8.6 mg/dL — AB (ref 8.9–10.3)
CO2: 27 mmol/L (ref 22–32)
CREATININE: 1.14 mg/dL (ref 0.61–1.24)
Chloride: 98 mmol/L — ABNORMAL LOW (ref 101–111)
Glucose, Bld: 108 mg/dL — ABNORMAL HIGH (ref 65–99)
Potassium: 3.5 mmol/L (ref 3.5–5.1)
SODIUM: 134 mmol/L — AB (ref 135–145)

## 2016-05-10 LAB — URINALYSIS COMPLETE WITH MICROSCOPIC (ARMC ONLY)
BILIRUBIN URINE: NEGATIVE
Bacteria, UA: NONE SEEN
Glucose, UA: NEGATIVE mg/dL
HGB URINE DIPSTICK: NEGATIVE
KETONES UR: NEGATIVE mg/dL
Leukocytes, UA: NEGATIVE
NITRITE: NEGATIVE
Protein, ur: NEGATIVE mg/dL
RBC / HPF: NONE SEEN RBC/hpf (ref 0–5)
Specific Gravity, Urine: 1.014 (ref 1.005–1.030)
Squamous Epithelial / LPF: NONE SEEN
pH: 7 (ref 5.0–8.0)

## 2016-05-10 LAB — CBC
HCT: 40.5 % (ref 40.0–52.0)
Hemoglobin: 14.4 g/dL (ref 13.0–18.0)
MCH: 32.7 pg (ref 26.0–34.0)
MCHC: 35.6 g/dL (ref 32.0–36.0)
MCV: 91.9 fL (ref 80.0–100.0)
Platelets: 212 10*3/uL (ref 150–440)
RBC: 4.41 MIL/uL (ref 4.40–5.90)
RDW: 13.5 % (ref 11.5–14.5)
WBC: 8.5 10*3/uL (ref 3.8–10.6)

## 2016-05-10 LAB — TROPONIN I

## 2016-05-10 NOTE — ED Triage Notes (Signed)
Pt in with co dizziness, headache, denies any hx of the same. States checked bp at home and came due to hypertension, no hx of the same.

## 2016-05-11 ENCOUNTER — Emergency Department
Admission: EM | Admit: 2016-05-11 | Discharge: 2016-05-11 | Disposition: A | Discharge status: Home / Self Care | Payer: Medicare Other | Attending: Emergency Medicine | Admitting: Emergency Medicine

## 2016-05-11 ENCOUNTER — Encounter: Payer: Self-pay | Admitting: Emergency Medicine

## 2016-05-11 DIAGNOSIS — R030 Elevated blood-pressure reading, without diagnosis of hypertension: Secondary | ICD-10-CM | POA: Diagnosis not present

## 2016-05-11 DIAGNOSIS — IMO0001 Reserved for inherently not codable concepts without codable children: Secondary | ICD-10-CM

## 2016-05-11 DIAGNOSIS — R42 Dizziness and giddiness: Secondary | ICD-10-CM

## 2016-05-11 LAB — TROPONIN I: Troponin I: <0.03 ng/mL (ref <0.03)

## 2016-05-11 MED ORDER — ACETAMINOPHEN 325 MG PO TABS
650.0000 mg | ORAL_TABLET | Freq: Once | ORAL | Status: AC
  Administered 2016-05-11: 650 mg via ORAL
  Filled 2016-05-11: qty 2

## 2016-05-11 NOTE — ED Provider Notes (Signed)
[facility] Emergency Department Provider Note   ____________________________________________   First MD Initiated Contact with Patient 05/11/16 [phone]     (approximate)  I have reviewed the triage vital signs and the nursing notes.   HISTORY  Chief Complaint Hypertension    HPI Dale [name] is a 65 y.o. male who comes into the hospital today with elevated blood pressure. He reports that he worked at his office earlier today. 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 Around lunchtime he was feeling a little bit disease and he decided to go home. He reports that he worked out and then started packing his car up for a camping trip he supposed to go on tomorrow. He reports that after moving and lifting some things for some time he started feeling dizzy again and had some tingling in his hands. The patient decided to check his blood pressure and it was in the 180s over 100s. He reports that he took a shower and intact again and it was in the 200s over 120s. The patient reports that he had a minimal episode of chest discomfort that came and went. He reports it was around 8:52 PM but was barely a 1 out of 10 in intensity. The patient denies any nausea or vomiting and he had no diarrhea. He's never had any problems with his blood pressure before. He reports that he has traveled recently and has had some increase in his salt intake due to fast food. He reports though that he does go to the gym when he tries to keep his weight down. He had a mild headache for which she took an Excedrin earlier. He also reports that he did not sleep much last night which is abnormal for him. He is here today for evaluation.The patient denies cough cold or runny nose   History reviewed. No pertinent past medical history.  Patient Active Problem List   Diagnosis Date Noted  . Hyponatremia 08/24/2014  . S/P rotator cuff surgery 08/24/2014  . GERD (gastroesophageal  reflux disease) 08/24/2014  . Postprocedural fever 08/24/2014  . Atelectasis 08/24/2014  . Chest pain 08/24/2014  . Constipation 08/24/2014    Past Surgical History:  Procedure Laterality Date  . SHOULDER ARTHROSCOPY    . TONSILLECTOMY      Prior to Admission medications   Medication Sig Start Date End Date Taking? Authorizing Provider  aspirin EC 81 MG tablet Take 81 mg by mouth daily.    Historical Provider, MD  azelastine (ASTELIN) 0.1 % nasal spray Place 1-2 sprays into both nostrils daily as needed for rhinitis or allergies. Use in each nostril as directed    Historical Provider, MD  azelastine (OPTIVAR) 0.05 % ophthalmic solution Place 1 drop into both eyes daily as needed (allergy).    Historical Provider, MD  bisacodyl (DULCOLAX) 5 MG EC tablet Take 15 mg by mouth daily as needed for moderate constipation.    Historical Provider, MD  docusate sodium (COLACE) 100 MG capsule Take 200 mg by mouth daily as needed for mild constipation.    Historical Provider, MD  omeprazole (PRILOSEC) 40 MG capsule Take 40 mg by mouth daily.    Historical Provider, MD  ondansetron (ZOFRAN) 4 MG tablet Take 4 mg by mouth every 6 (six) hours as needed for nausea or vomiting.    Historical Provider, MD  OxyCODONE (OXYCONTIN) 20 mg T12A 12 hr tablet Take 20 mg by mouth every 12 (twelve) hours as  needed (pain).    Historical Provider, MD  oxyCODONE-acetaminophen (PERCOCET/ROXICET) 5-325 MG per tablet Take 1-2 tablets by mouth every 4 (four) hours as needed for moderate pain. 08/24/14   Nita Sells, MD  TESTOSTERONE TD Place 1 application onto the skin daily.    Historical Provider, MD  triamcinolone (NASACORT ALLERGY 24HR) 55 MCG/ACT AERO nasal inhaler Place 2 sprays into the nose daily as needed (allergies).    Historical Provider, MD    Allergies Review of patient's allergies indicates no known allergies.  History reviewed. No pertinent family history.  Social History Social History    Substance Use Topics  . Smoking status: Never Smoker  . Smokeless tobacco: Never Used  . Alcohol use Yes     Comment: occasionally    Review of Systems Constitutional:  fever Eyes: No visual changes. ENT: No sore throat. Cardiovascular:  chest pain. Respiratory: Denies shortness of breath. Gastrointestinal: No abdominal pain.  No nausea, no vomiting.  No diarrhea.  No constipation. Genitourinary: Negative for dysuria. Musculoskeletal: Negative for back pain. Skin: Negative for rash. Neurological: Negative for headaches, focal weakness or numbness.  10-point ROS otherwise negative.  ____________________________________________   PHYSICAL EXAM:  VITAL SIGNS: ED Triage Vitals  Enc Vitals Group     BP 05/10/16 2215 (!) 166/97     Pulse Rate 05/10/16 2215 88     Resp 05/10/16 2215 18     Temp 05/10/16 2215 (!) 100.8 F (38.2 C)     Temp Source 05/10/16 2215 Oral     SpO2 05/10/16 2215 98 %     Weight 05/10/16 2215 176 lb (79.8 kg)     Height 05/10/16 2215 5\' 10"  (1.778 m)     Head Circumference --      Peak Flow --      Pain Score 05/11/16 0054 7     Pain Loc --      Pain Edu? --      Excl. in [place]? --     Constitutional: Alert and oriented. Well appearing and in no acute distress. Eyes: Conjunctivae are normal. PERRL. EOMI. Head: Atraumatic. Nose: No congestion/rhinnorhea. Mouth/Throat: Mucous membranes are moist.  Oropharynx non-erythematous. Cardiovascular: Normal rate, regular rhythm. Grossly normal heart sounds.  Good peripheral circulation. No carotid bruits auscultated Respiratory: Normal respiratory effort.  No retractions. Lungs CTAB. Gastrointestinal: Soft and nontender. No distention. Positive bowel sounds Musculoskeletal: No lower extremity tenderness nor edema.  No joint effusions. Neurologic:  Normal speech and language. Cranial nerves II through XII are grossly intact with no focal motor or neuro deficits Skin:  Skin is warm, dry and intact. No rash  noted. Psychiatric: Mood and affect are normal.   ____________________________________________   LABS (all labs ordered are listed, but only abnormal results are displayed)  Labs Reviewed  BASIC METABOLIC PANEL - Abnormal; Notable for the following:       Result Value   Sodium 134 (*)    Chloride 98 (*)    Glucose, Bld 108 (*)    Calcium 8.6 (*)    All other components within normal limits  URINALYSIS COMPLETEWITH MICROSCOPIC ([facility] ONLY) - Abnormal; Notable for the following:    Color, Urine YELLOW (*)    APPearance CLEAR (*)    All other components within normal limits  CBC  TROPONIN I  TROPONIN I   ____________________________________________  EKG  ED ECG REPORT I, Loney Hering, the attending physician, personally viewed and interpreted this ECG.   Date: 05/11/2016  EKG Time: 2221  Rate: 86  Rhythm: normal sinus rhythm  Axis: normal  Intervals:none  ST&T Change: none  ____________________________________________  RADIOLOGY  none ____________________________________________   PROCEDURES  Procedure(s) performed: None  Procedures  Critical Care performed: No  ____________________________________________   INITIAL IMPRESSION / ASSESSMENT AND PLAN / ED COURSE  Pertinent labs & imaging results that were available during my care of the patient were reviewed by me and considered in my medical decision making (see chart for details).  This is a 65 year old male who comes into the hospital today with some elevated blood pressure. The patient also has some mild dizziness earlier in the day. The patient did have some blood work which was unremarkable and his symptoms are resolved at this point. The patient did have an elevated temperature when he initially arrived but it has improved without intervention. The patient's blood pressure is also improved without intervention. I will repeat the patient's troponin is unremarkable I will likely discharge the  patient home. There is a possibility for the patient's blood pressure is elevated due to his increase in salt intake as well as the strenuous activities he was doing earlier today. The patient agrees with this thought. He will be reassessed once I received the results of the troponin.  Clinical Course   The patient's repeat troponin is unremarkable. He reports that he is feeling well. I will let the patient go home as his blood pressure has significantly improved. He is 139/85. I discussed with the patient that he should follow up with his primary care physician. Otherwise the patient has no further complaints or concerns. He will be discharged home to follow-up.  ____________________________________________   FINAL CLINICAL IMPRESSION(S) / ED DIAGNOSES  Final diagnoses:  Elevated blood pressure  Dizziness      NEW MEDICATIONS STARTED DURING THIS VISIT:  Discharge Medication List as of 05/11/2016  3:13 AM       Note:  This document was prepared using Dragon voice recognition software and may include unintentional dictation errors.    Loney Hering, MD 05/11/16 [phone]

## 2016-05-15 ENCOUNTER — Encounter: Payer: Self-pay | Admitting: Emergency Medicine

## 2016-05-15 ENCOUNTER — Inpatient Hospital Stay
Admission: EM | Admit: 2016-05-15 | Discharge: 2016-05-18 | DRG: 866 | Disposition: A | Discharge status: Home / Self Care | Payer: Medicare Other | Attending: Internal Medicine | Admitting: Internal Medicine

## 2016-05-15 ENCOUNTER — Emergency Department: Payer: Medicare Other

## 2016-05-15 DIAGNOSIS — R509 Fever, unspecified: Secondary | ICD-10-CM | POA: Diagnosis present

## 2016-05-15 DIAGNOSIS — R21 Rash and other nonspecific skin eruption: Secondary | ICD-10-CM | POA: Diagnosis present

## 2016-05-15 DIAGNOSIS — A938 Other specified arthropod-borne viral fevers: Secondary | ICD-10-CM | POA: Diagnosis present

## 2016-05-15 DIAGNOSIS — R740 Nonspecific elevation of levels of transaminase and lactic acid dehydrogenase [LDH]: Secondary | ICD-10-CM | POA: Diagnosis present

## 2016-05-15 DIAGNOSIS — E876 Hypokalemia: Secondary | ICD-10-CM

## 2016-05-15 DIAGNOSIS — R7401 Elevation of levels of liver transaminase levels: Secondary | ICD-10-CM

## 2016-05-15 DIAGNOSIS — E871 Hypo-osmolality and hyponatremia: Secondary | ICD-10-CM | POA: Diagnosis present

## 2016-05-15 DIAGNOSIS — K120 Recurrent oral aphthae: Secondary | ICD-10-CM | POA: Diagnosis present

## 2016-05-15 DIAGNOSIS — K1379 Other lesions of oral mucosa: Secondary | ICD-10-CM

## 2016-05-15 LAB — BASIC METABOLIC PANEL
Anion gap: 8 (ref 5–15)
BUN: 10 mg/dL (ref 6–20)
CALCIUM: 7.9 mg/dL — AB (ref 8.9–10.3)
CO2: 25 mmol/L (ref 22–32)
CREATININE: 0.97 mg/dL (ref 0.61–1.24)
Chloride: 95 mmol/L — ABNORMAL LOW (ref 101–111)
GFR calc Af Amer: >60 mL/min (ref >60)
Glucose, Bld: 124 mg/dL — ABNORMAL HIGH (ref 65–99)
Potassium: 3.4 mmol/L — ABNORMAL LOW (ref 3.5–5.1)
SODIUM: 128 mmol/L — AB (ref 135–145)

## 2016-05-15 LAB — URINALYSIS COMPLETE WITH MICROSCOPIC (ARMC ONLY)
Bacteria, UA: NONE SEEN
Bilirubin Urine: NEGATIVE
Glucose, UA: NEGATIVE mg/dL
Hgb urine dipstick: NEGATIVE
LEUKOCYTES UA: NEGATIVE
NITRITE: NEGATIVE
PROTEIN: NEGATIVE mg/dL
Specific Gravity, Urine: 1.014 (ref 1.005–1.030)
Squamous Epithelial / LPF: NONE SEEN
pH: 7 (ref 5.0–8.0)

## 2016-05-15 LAB — CBC
HCT: 39.2 % — ABNORMAL LOW (ref 40.0–52.0)
Hemoglobin: 13.9 g/dL (ref 13.0–18.0)
MCH: 32.5 pg (ref 26.0–34.0)
MCHC: 35.5 g/dL (ref 32.0–36.0)
MCV: 91.7 fL (ref 80.0–100.0)
PLATELETS: 185 10*3/uL (ref 150–440)
RBC: 4.27 MIL/uL — ABNORMAL LOW (ref 4.40–5.90)
RDW: 13.5 % (ref 11.5–14.5)
WBC: 10.6 10*3/uL (ref 3.8–10.6)

## 2016-05-15 LAB — CBC WITH DIFFERENTIAL/PLATELET
Basophils Absolute: 0 10*3/uL (ref 0–0.1)
Eosinophils Absolute: 0.1 10*3/uL (ref 0–0.7)
Eosinophils Relative: 1 %
HEMATOCRIT: 38.8 % — AB (ref 40.0–52.0)
HEMOGLOBIN: 14 g/dL (ref 13.0–18.0)
Lymphocytes Relative: 12 %
Lymphs Abs: 1.2 10*3/uL (ref 1.0–3.6)
MCH: 32.8 pg (ref 26.0–34.0)
MCHC: 36.1 g/dL — AB (ref 32.0–36.0)
MCV: 90.8 fL (ref 80.0–100.0)
Monocytes Absolute: 0.9 10*3/uL (ref 0.2–1.0)
NEUTROS ABS: 8.2 10*3/uL — AB (ref 1.4–6.5)
Neutrophils Relative %: 79 %
Platelets: 180 10*3/uL (ref 150–440)
RBC: 4.27 MIL/uL — ABNORMAL LOW (ref 4.40–5.90)
RDW: 13.3 % (ref 11.5–14.5)
WBC: 10.4 10*3/uL (ref 3.8–10.6)

## 2016-05-15 LAB — HEPATIC FUNCTION PANEL
ALBUMIN: 3.3 g/dL — AB (ref 3.5–5.0)
ALK PHOS: 113 U/L (ref 38–126)
ALT: 42 U/L (ref 17–63)
AST: 43 U/L — AB (ref 15–41)
BILIRUBIN INDIRECT: 0.4 mg/dL (ref 0.3–0.9)
Bilirubin, Direct: 0.2 mg/dL (ref 0.1–0.5)
TOTAL PROTEIN: 6.1 g/dL — AB (ref 6.5–8.1)
Total Bilirubin: 0.6 mg/dL (ref 0.3–1.2)

## 2016-05-15 LAB — LACTIC ACID, PLASMA: Lactic Acid, Venous: 1.5 mmol/L (ref 0.5–1.9)

## 2016-05-15 LAB — POCT RAPID STREP A: Streptococcus, Group A Screen (Direct): NEGATIVE

## 2016-05-15 MED ORDER — ACETAMINOPHEN 325 MG PO TABS
650.0000 mg | ORAL_TABLET | Freq: Four times a day (QID) | ORAL | Status: DC | PRN
Start: 2016-05-15 — End: 2016-05-18
  Administered 2016-05-16 – 2016-05-18 (×9): 650 mg via ORAL
  Filled 2016-05-15 (×9): qty 2

## 2016-05-15 MED ORDER — SODIUM CHLORIDE 0.9 % IV SOLN
INTRAVENOUS | Status: DC
  Administered 2016-05-16: 06:00:00 via INTRAVENOUS

## 2016-05-15 MED ORDER — AZELASTINE HCL 0.1 % NA SOLN
1.0000 | Freq: Every day | NASAL | Status: DC | PRN
  Filled 2016-05-15: qty 30

## 2016-05-15 MED ORDER — ONDANSETRON HCL 4 MG PO TABS: 4.0000 mg | ORAL_TABLET | Freq: Four times a day (QID) | ORAL | Status: DC | PRN

## 2016-05-15 MED ORDER — HYDROCODONE-ACETAMINOPHEN 5-325 MG PO TABS
1.0000 | ORAL_TABLET | ORAL | Status: DC | PRN
  Administered 2016-05-15: 1 via ORAL
  Filled 2016-05-15: qty 1

## 2016-05-15 MED ORDER — DEXTROSE 5 % IV SOLN
500.0000 mg | INTRAVENOUS | Status: DC
  Administered 2016-05-15: 500 mg via INTRAVENOUS
  Filled 2016-05-15 (×2): qty 500

## 2016-05-15 MED ORDER — ACETAMINOPHEN 500 MG PO TABS
1000.0000 mg | ORAL_TABLET | Freq: Once | ORAL | Status: AC
  Administered 2016-05-15: 1000 mg via ORAL
  Filled 2016-05-15: qty 2

## 2016-05-15 MED ORDER — TRAZODONE HCL 50 MG PO TABS
25.0000 mg | ORAL_TABLET | Freq: Every evening | ORAL | Status: DC | PRN
  Administered 2016-05-17: 25 mg via ORAL
  Filled 2016-05-15 (×2): qty 1

## 2016-05-15 MED ORDER — DEXTROSE 5 % IV SOLN
1.0000 g | INTRAVENOUS | Status: DC
  Administered 2016-05-15 – 2016-05-17 (×3): 1 g via INTRAVENOUS
  Filled 2016-05-15 (×4): qty 10

## 2016-05-15 MED ORDER — TRIAMCINOLONE ACETONIDE 55 MCG/ACT NA AERO: 2.0000 | INHALATION_SPRAY | Freq: Every day | NASAL | Status: DC | PRN

## 2016-05-15 MED ORDER — PANTOPRAZOLE SODIUM 40 MG PO TBEC
40.0000 mg | DELAYED_RELEASE_TABLET | Freq: Every day | ORAL | Status: DC
Start: 2016-05-15 — End: 2016-05-18
  Administered 2016-05-16 – 2016-05-18 (×3): 40 mg via ORAL
  Filled 2016-05-15 (×3): qty 1

## 2016-05-15 MED ORDER — ACETAMINOPHEN 650 MG RE SUPP: 650.0000 mg | Freq: Four times a day (QID) | RECTAL | Status: DC | PRN

## 2016-05-15 MED ORDER — FLUTICASONE PROPIONATE 50 MCG/ACT NA SUSP
2.0000 | Freq: Every day | NASAL | Status: DC | PRN
  Filled 2016-05-15: qty 16

## 2016-05-15 MED ORDER — DOXYCYCLINE HYCLATE 100 MG PO TABS
100.0000 mg | ORAL_TABLET | Freq: Two times a day (BID) | ORAL | Status: DC
  Administered 2016-05-15 – 2016-05-18 (×6): 100 mg via ORAL
  Filled 2016-05-15 (×7): qty 1

## 2016-05-15 MED ORDER — KETOTIFEN FUMARATE 0.025 % OP SOLN
1.0000 [drp] | Freq: Two times a day (BID) | OPHTHALMIC | Status: DC
  Filled 2016-05-15: qty 5

## 2016-05-15 MED ORDER — ACETAMINOPHEN 500 MG PO TABS
ORAL_TABLET | ORAL | Status: AC
  Administered 2016-05-15: 1000 mg via ORAL
  Filled 2016-05-15: qty 2

## 2016-05-15 MED ORDER — SODIUM CHLORIDE 0.9 % IV BOLUS (SEPSIS)
1000.0000 mL | Freq: Once | INTRAVENOUS | Status: AC
  Administered 2016-05-15: 1000 mL via INTRAVENOUS

## 2016-05-15 MED ORDER — TESTOSTERONE 4 MG/24HR TD PT24: 1.0000 | MEDICATED_PATCH | Freq: Every day | TRANSDERMAL | Status: DC

## 2016-05-15 MED ORDER — ASPIRIN EC 81 MG PO TBEC
81.0000 mg | DELAYED_RELEASE_TABLET | Freq: Every day | ORAL | Status: DC
  Administered 2016-05-16 – 2016-05-18 (×3): 81 mg via ORAL
  Filled 2016-05-15 (×3): qty 1

## 2016-05-15 MED ORDER — ONDANSETRON HCL 4 MG/2ML IJ SOLN
4.0000 mg | Freq: Four times a day (QID) | INTRAMUSCULAR | Status: DC | PRN
Start: 2016-05-15 — End: 2016-05-18
  Administered 2016-05-16: 4 mg via INTRAVENOUS
  Filled 2016-05-15: qty 2

## 2016-05-15 MED ORDER — DOCUSATE SODIUM 100 MG PO CAPS: 100.0000 mg | ORAL_CAPSULE | Freq: Two times a day (BID) | ORAL | Status: DC

## 2016-05-15 MED ORDER — ENOXAPARIN SODIUM 40 MG/0.4ML ~~LOC~~ SOLN
40.0000 mg | SUBCUTANEOUS | Status: DC
  Administered 2016-05-15 – 2016-05-17 (×3): 40 mg via SUBCUTANEOUS
  Filled 2016-05-15 (×3): qty 0.4

## 2016-05-15 MED ORDER — BISACODYL 5 MG PO TBEC: 5.0000 mg | DELAYED_RELEASE_TABLET | Freq: Every day | ORAL | Status: DC | PRN

## 2016-05-15 NOTE — ED Provider Notes (Signed)
[facility] Emergency Department Provider Note   ____________________________________________   First MD Initiated Contact with Patient 05/15/16 1504     (approximate)  I have reviewed the triage vital signs and the nursing notes.   HISTORY  Chief Complaint Dizziness and Wound Infection   HPI Dale [name] is a 65 y.o. 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 He reports he got dizzy and hypertensive on the second was seen in clinic and sent to the ER. Blood pressure and dizziness improved in the ER and was sent home for follow-up. Patient has since been running a fever 100 201 been drinking a lot of fluids is developed a headache that runs from the back of his neck up into his head is pounding he has a sore throat he has some small lesions in the back of his lower lip on the sheets on the inside of his lower lip and a rash on the right side of his chest around the site of his recent basal cell cancer removal. He also had an infected toenail on the right great toe this is improved. Patient complains of lightheadedness at times and some high blood pressure still. Here in the ER patient was tachycardic to 101 when I came in the room is flushed and hot and his blood pressures only 107.  Patient reports she has traveled out of the country but that was in spring many months ago he has had several mosquito bites has had also had to remove some ticks from him but none of them were embedded.  History reviewed. No pertinent past medical history.  Patient Active Problem List   Diagnosis Date Noted  . Hyponatremia 08/24/2014  . S/P rotator cuff surgery 08/24/2014  . GERD (gastroesophageal reflux disease) 08/24/2014  . Postprocedural fever 08/24/2014  . Atelectasis 08/24/2014  . Chest pain 08/24/2014  . Constipation 08/24/2014    Past Surgical History:  Procedure Laterality Date  . SHOULDER  ARTHROSCOPY    . TONSILLECTOMY      Prior to Admission medications   Medication Sig Start Date End Date Taking? Authorizing Provider  aspirin EC 81 MG tablet Take 81 mg by mouth daily.    Historical Provider, MD  azelastine (ASTELIN) 0.1 % nasal spray Place 1-2 sprays into both nostrils daily as needed for rhinitis or allergies. Use in each nostril as directed    Historical Provider, MD  azelastine (OPTIVAR) 0.05 % ophthalmic solution Place 1 drop into both eyes daily as needed (allergy).    Historical Provider, MD  bisacodyl (DULCOLAX) 5 MG EC tablet Take 15 mg by mouth daily as needed for moderate constipation.    Historical Provider, MD  docusate sodium (COLACE) 100 MG capsule Take 200 mg by mouth daily as needed for mild constipation.    Historical Provider, MD  omeprazole (PRILOSEC) 40 MG capsule Take 40 mg by mouth daily.    Historical Provider, MD  ondansetron (ZOFRAN) 4 MG tablet Take 4 mg by mouth every 6 (six) hours as needed for nausea or vomiting.    Historical Provider, MD  OxyCODONE (OXYCONTIN) 20 mg T12A 12 hr tablet Take 20 mg by mouth every 12 (twelve) hours as needed (pain).    Historical Provider, MD  oxyCODONE-acetaminophen (PERCOCET/ROXICET) 5-325 MG per tablet Take 1-2 tablets by mouth every 4 (four) hours as needed for moderate pain. 08/24/14   Nita Sells, MD  TESTOSTERONE TD Place 1 application  onto the skin daily.    Historical Provider, MD  triamcinolone (NASACORT ALLERGY 24HR) 55 MCG/ACT AERO nasal inhaler Place 2 sprays into the nose daily as needed (allergies).    Historical Provider, MD    Allergies Review of patient's allergies indicates no known allergies.  History reviewed. No pertinent family history.  Social History Social History  Substance Use Topics  . Smoking status: Never Smoker  . Smokeless tobacco: Never Used  . Alcohol use Yes     Comment: occasionally    Review of Systems Constitutional:  fever/chills Eyes: No visual  changes. ENT: sore throat. Cardiovascular: Denies chest pain. Respiratory: Denies shortness of breath. Gastrointestinal: No abdominal pain.  No nausea, no vomiting.  No diarrhea.  No constipation. Genitourinary: Negative for dysuria. Musculoskeletal: Negative for back pain. Skin: Negative for rash. Neurological: Negative for  focal weakness or numbness.  10-point ROS otherwise negative.  ____________________________________________   PHYSICAL EXAM:  VITAL SIGNS: ED Triage Vitals  Enc Vitals Group     BP 05/15/16 1421 108/65     Pulse Rate 05/15/16 1421 (!) 105     Resp 05/15/16 1421 18     Temp 05/15/16 1421 100.3 F (37.9 C)     Temp Source 05/15/16 1421 Oral     SpO2 05/15/16 1421 98 %     Weight 05/15/16 1421 176 lb (79.8 kg)     Height 05/15/16 1421 5' 10.5" (1.791 m)     Head Circumference --      Peak Flow --      Pain Score 05/15/16 1422 6     Pain Loc --      Pain Edu? --      Excl. in Magazine? --     Constitutional: Alert and oriented. Well appearing and in no acute distress. Eyes: Conjunctivae are normal. PERRL. EOMI. Head: Atraumatic. Nose: No congestion/rhinnorhea. Mouth/Throat: Patient has some small superficial ulcers on the inside of the lower lip. His throat is somewhat red especially in the left side. Ears TMs are clear bilaterally Neck: No stridor.   Hematological/Lymphatic/Immunilogical: No cervical lymphadenopathy. Cardiovascular: Normal rate, regular rhythm. Grossly normal heart sounds.  Good peripheral circulation. Respiratory: Normal respiratory effort.  No retractions. Lungs CTAB. Gastrointestinal: Soft and nontender. No distention. No abdominal bruits. No CVA tenderness. Musculoskeletal: No lower extremity tenderness nor edema.  No joint effusions. Neurologic:  Normal speech and language. No gross focal neurologic deficits are appreciated. Cranial nerves II through XII are intact cerebellar finger-nose rapid alternating movements are normal motor  strength is 5 over 5 throughout sensation is intact Skin:  Skin is warm, dry and intact. No rash noted except for in the right lateral chest and along the undersurface of the right arm in the area of the triceps where there is a dark red maculopapular rash. This is not painful or HE there is no list during. Psychiatric: Mood and affect are normal. Speech and behavior are normal.  ____________________________________________   LABS (all labs ordered are listed, but only abnormal results are displayed)  Labs Reviewed  BASIC METABOLIC PANEL - Abnormal; Notable for the following:       Result Value   Sodium 128 (*)    Potassium 3.4 (*)    Chloride 95 (*)    Glucose, Bld 124 (*)    Calcium 7.9 (*)    All other components within normal limits  CBC - Abnormal; Notable for the following:    RBC 4.27 (*)    HCT 39.2 (*)  All other components within normal limits  URINALYSIS COMPLETEWITH MICROSCOPIC ([facility] ONLY) - Abnormal; Notable for the following:    Color, Urine YELLOW (*)    APPearance CLEAR (*)    Ketones, ur TRACE (*)    All other components within normal limits  CBC WITH DIFFERENTIAL/PLATELET - Abnormal; Notable for the following:    RBC 4.27 (*)    HCT 38.8 (*)    MCHC 36.1 (*)    Neutro Abs 8.2 (*)    All other components within normal limits  HEPATIC FUNCTION PANEL - Abnormal; Notable for the following:    Total Protein 6.1 (*)    Albumin 3.3 (*)    AST 43 (*)    All other components within normal limits  CULTURE, BLOOD (ROUTINE X 2)  CULTURE, BLOOD (ROUTINE X 2)  URINE CULTURE  LACTIC ACID, PLASMA  LACTIC ACID, PLASMA  B. BURGDORFI ANTIBODIES  POCT RAPID STREP A   ____________________________________________  EKG  EKG read and interpreted by me shows normal sinus rhythm rate of 98 there is nonsignificant ST elevation in V2 and V3 otherwise there are no acute changes ____________________________________________  RADIOLOGY  Chest x-ray shows no acute disease  per radiology there is a little bit of atelectasis which may perhaps be a pneumonia ____________________________________________   PROCEDURES   Procedures    ____________________________________________   INITIAL IMPRESSION / ASSESSMENT AND PLAN / ED COURSE  Pertinent labs & imaging results that were available during my care of the patient were reviewed by me and considered in my medical decision making (see chart for details).    Clinical Course     ____________________________________________   FINAL CLINICAL IMPRESSION(S) / ED DIAGNOSES  Final diagnoses:  Hyponatremia  Fever, unspecified fever cause      NEW MEDICATIONS STARTED DURING THIS VISIT:  New Prescriptions   No medications on file     Note:  This document was prepared using Dragon voice recognition software and may include unintentional dictation errors.    Nena Polio, MD 05/15/16 [phone]

## 2016-05-15 NOTE — ED Triage Notes (Signed)
Pt was a [place] when he started feeling lightheaded and dizzy and was sent over here. Pt states his BP was elevated early this week and dizziness started on Tuesday and has progressively worsened.  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  Pt states he has an infection around his right great toe that is draining pus and is worried about infection. Pt also c/o not being about to take a deep breath in his upper lobes.  Pt has poor eye contact in triage.  Family at bedside.

## 2016-05-15 NOTE — ED Triage Notes (Signed)
Pt has rash to right lateral rib cage near where he had skin cancer removed.

## 2016-05-15 NOTE — H&P (Signed)
[facility] [place] NAME: Syris Vosburg    MR#:  [id number]  DATE OF BIRTH:  02-23-1951  DATE OF ADMISSION:  05/15/2016  PRIMARY CARE PHYSICIAN: Madelyn Brunner, MD   REQUESTING/REFERRING PHYSICIAN: Dr. Conni Slipper  CHIEF COMPLAINT: Fever or dizziness    Chief Complaint  Patient presents with  . Dizziness  . Wound Infection    HISTORY OF PRESENT ILLNESS:  Viyan Demeo  is a 65 y.o. male with No past medical history comes in with fever, dizziness. Patient was hypotensive with blood pressure 220/120 last week since Tuesday and he also was feeling very tired and fatigued patient works as a Geographical information systems officer. But since last Tuesday having issues with blood pressure and blood pressure check at home was 220/110. Patient has no history of essential hypertension. Patient was seen in the emergency room 2 days ago and was discharged home. Patient complains of sore throat, headache, neck pain for the last 3 days. And she he also has lots of fatigue.Marland Kitchen  Has a rash in the right axilla going on for last 3 days. \ Patient states that he had several mosquito bites and also had to remove some ticks  but none of them were embedded.  complains of dry cough, headache,, generalized body pains, chest pain across the chest also noted to have aphthous ulcers in the lower.lip.  PAST SURGICAL HISTOIRY:   Past Surgical History:  Procedure Laterality Date  . SHOULDER ARTHROSCOPY    . TONSILLECTOMY      SOCIAL HISTORY:   Social History  Substance Use Topics  . Smoking status: Never Smoker  . Smokeless tobacco: Never Used  . Alcohol use Yes     Comment: occasionally    FAMILY HISTORY:  History reviewed. No pertinent family history.  DRUG ALLERGIES:  No Known Allergies  REVIEW OF SYSTEMS:  CONSTITUTIONAL: Fever fatigue.  EYES: No blurred or double vision.  EARS, NOSE, AND THROAT: No tinnitus or ear pain.  RESPIRATORY: dry cough shortness  of breath, wheezing or hemoptysis.  CARDIOVASCULAR:  chest pain, orthopnea, edema.  GASTROINTESTINAL: No nausea, vomiting, diarrhea or abdominal pain.  GENITOURINARY: No dysuria, hematuria.  ENDOCRINE: No polyuria, nocturia,  HEMATOLOGY: No anemia, easy bruising or bleeding SKIN: No rash or lesion. MUSCULOSKELETAL:generalized body pains. NEUROLOGIC: No tingling, numbness, weakness.  PSYCHIATRY: No anxiety or depression.   MEDICATIONS AT HOME:   Prior to Admission medications   Medication Sig Start Date End Date Taking? Authorizing Provider  aspirin EC 81 MG tablet Take 81 mg by mouth daily.    Historical Provider, MD  azelastine (ASTELIN) 0.1 % nasal spray Place 1-2 sprays into both nostrils daily as needed for rhinitis or allergies. Use in each nostril as directed    Historical Provider, MD  azelastine (OPTIVAR) 0.05 % ophthalmic solution Place 1 drop into both eyes daily as needed (allergy).    Historical Provider, MD  bisacodyl (DULCOLAX) 5 MG EC tablet Take 15 mg by mouth daily as needed for moderate constipation.    Historical Provider, MD  docusate sodium (COLACE) 100 MG capsule Take 200 mg by mouth daily as needed for mild constipation.    Historical Provider, MD  omeprazole (PRILOSEC) 40 MG capsule Take 40 mg by mouth daily.    Historical Provider, MD  ondansetron (ZOFRAN) 4 MG tablet Take 4 mg by mouth every 6 (six) hours as needed for nausea or vomiting.    Historical Provider, MD  OxyCODONE (  OXYCONTIN) 20 mg T12A 12 hr tablet Take 20 mg by mouth every 12 (twelve) hours as needed (pain).    Historical Provider, MD  oxyCODONE-acetaminophen (PERCOCET/ROXICET) 5-325 MG per tablet Take 1-2 tablets by mouth every 4 (four) hours as needed for moderate pain. 08/24/14   Nita Sells, MD  TESTOSTERONE TD Place 1 application onto the skin daily.    Historical Provider, MD  triamcinolone (NASACORT ALLERGY 24HR) 55 MCG/ACT AERO nasal inhaler Place 2 sprays into the nose daily as needed  (allergies).    Historical Provider, MD      VITAL SIGNS:  Blood pressure 107/74, pulse 98, temperature (!) 101.2 F (38.4 C), temperature source Oral, resp. rate 16, height 5' 10.5" (1.791 m), weight 79.8 kg (176 lb), SpO2 91 %.  PHYSICAL EXAMINATION:  GENERAL:  65 y.o.-year-old patient lying in the bed with no acute distress.  EYES: Pupils equal, round, reactive to light and accommodation. No scleral icterus. Extraocular muscles intact.  HEENT: Head atraumatic, normocephalic. Oropharynx and nasopharynx clear.  NECK:  Supple, no jugular venous distention. No thyroid enlargement, no tenderness.  LUNGS: Normal breath sounds bilaterally, no wheezing, rales,rhonchi or crepitation. No use of accessory muscles of respiration.  CARDIOVASCULAR: S1, S2 normal. No murmurs, rubs, or gallops.  ABDOMEN: Soft, nontender, nondistended. Bowel sounds present. No organomegaly or mass.  EXTREMITIES: No pedal edema, cyanosis, or clubbing.  NEUROLOGIC: Cranial nerves II through XII are intact. Muscle strength 5/5 in all extremities. Sensation intact. Gait not checked.  PSYCHIATRIC: The patient is alert and oriented x 3.  SKIN: No obvious rash, lesion, or ulcer.   LABORATORY PANEL:   CBC  Recent Labs Lab 05/15/16 1443  WBC 10.6  10.4  HGB 13.9  14.0  HCT 39.2*  38.8*  PLT 185  180   ------------------------------------------------------------------------------------------------------------------  Chemistries   Recent Labs Lab 05/15/16 1443  NA 128*  K 3.4*  CL 95*  CO2 25  GLUCOSE 124*  BUN 10  CREATININE 0.97  CALCIUM 7.9*  AST 43*  ALT 42  ALKPHOS 113  BILITOT 0.6   ------------------------------------------------------------------------------------------------------------------  Cardiac Enzymes  Recent Labs Lab 05/11/16 0121  TROPONINI <0.03    ------------------------------------------------------------------------------------------------------------------  RADIOLOGY:  Dg Chest Portable 1 View  Result Date: 05/15/2016 CLINICAL DATA:  65 year old male with history of lightheadedness and dizziness. Elevated blood pressure. EXAM: PORTABLE CHEST 1 VIEW COMPARISON:  Chest x-ray 08/23/2014. FINDINGS: Lung volumes are low. Opacity at the left base favored to reflect subsegmental atelectasis. No definite acute consolidative airspace disease. No pleural effusions. No evidence of pulmonary edema. Heart size is normal. The patient is rotated to the left on today's exam, resulting in distortion of the mediastinal contours and reduced diagnostic sensitivity and specificity for mediastinal pathology. IMPRESSION: 1. Low lung volumes with probable subsegmental atelectasis in the left lower lobe. Electronically Signed   By: Vinnie Langton M.D.   On: 05/15/2016 16:58    EKG:   Orders placed or performed during the hospital encounter of 05/15/16  . EKG 12-Lead  . EKG 12-Lead  . ED EKG  . ED EKG   Normal sinus rhythm 98 bpm no ST-T changes  IMPRESSION AND PLAN:  [age] year old male patient with fever, fatigue, generalized body pains, dizziness and rash in axilla symptoms are concerning for sepsis with community-acquired pneumonia and possible tickborne illness. Started on Rocephin, Zithromax, doxycycline. Admitted to hospitalist service. Checked a Lyme serology.   #2 hyponatremia likely due to fever and dehydration started on IV hydration. #3. Community Acquired  pneumonia continue Rocephin, Zithromax. #4. Aphthous ulcers in the mouth   GI, DVT prophylaxis  Discussed the patient, patient's wife.  All the records are reviewed and case discussed with ED provider. Management plans discussed with the patient, family and they are in agreement.  CODE STATUS: full  TOTAL TIME TAKING CARE OF THIS PATIENT:55 minutes.    Epifanio Lesches M.D on  05/15/2016 at 6:44 PM  Between 7am to 6pm - Pager - [phone]  After 6pm go to [email or web address] - password [email or web address] [facility]  Office  [phone]  CC: Primary care physician; Madelyn Brunner, MD  Note: This dictation was prepared with Dragon dictation along with smaller phrase technology. Any transcriptional errors that result from this process are unintentional.

## 2016-05-16 LAB — BASIC METABOLIC PANEL
ANION GAP: 9 (ref 5–15)
BUN: 10 mg/dL (ref 6–20)
CHLORIDE: 96 mmol/L — AB (ref 101–111)
CO2: 24 mmol/L (ref 22–32)
CREATININE: 0.88 mg/dL (ref 0.61–1.24)
Calcium: 7.5 mg/dL — ABNORMAL LOW (ref 8.9–10.3)
GFR calc non Af Amer: >60 mL/min (ref >60)
Glucose, Bld: 130 mg/dL — ABNORMAL HIGH (ref 65–99)
Potassium: 3.4 mmol/L — ABNORMAL LOW (ref 3.5–5.1)
SODIUM: 129 mmol/L — AB (ref 135–145)

## 2016-05-16 LAB — COMPREHENSIVE METABOLIC PANEL
ALK PHOS: 116 U/L (ref 38–126)
ALT: 43 U/L (ref 17–63)
AST: 22 U/L (ref 15–41)
Albumin: 2.9 g/dL — ABNORMAL LOW (ref 3.5–5.0)
Anion gap: 7 (ref 5–15)
BUN: 10 mg/dL (ref 6–20)
CALCIUM: 7.7 mg/dL — AB (ref 8.9–10.3)
CO2: 27 mmol/L (ref 22–32)
CREATININE: 0.83 mg/dL (ref 0.61–1.24)
Chloride: 97 mmol/L — ABNORMAL LOW (ref 101–111)
Glucose, Bld: 121 mg/dL — ABNORMAL HIGH (ref 65–99)
Potassium: 3.3 mmol/L — ABNORMAL LOW (ref 3.5–5.1)
SODIUM: 131 mmol/L — AB (ref 135–145)
Total Bilirubin: 0.6 mg/dL (ref 0.3–1.2)
Total Protein: 5.6 g/dL — ABNORMAL LOW (ref 6.5–8.1)

## 2016-05-16 LAB — CBC
HCT: 39.1 % — ABNORMAL LOW (ref 40.0–52.0)
Hemoglobin: 13.7 g/dL (ref 13.0–18.0)
MCH: 32.6 pg (ref 26.0–34.0)
MCHC: 35.1 g/dL (ref 32.0–36.0)
MCV: 92.8 fL (ref 80.0–100.0)
PLATELETS: 163 10*3/uL (ref 150–440)
RBC: 4.21 MIL/uL — AB (ref 4.40–5.90)
RDW: 13.6 % (ref 11.5–14.5)
WBC: 9.8 10*3/uL (ref 3.8–10.6)

## 2016-05-16 LAB — HEPATIC FUNCTION PANEL
ALBUMIN: 3 g/dL — AB (ref 3.5–5.0)
ALK PHOS: 126 U/L (ref 38–126)
ALT: 54 U/L (ref 17–63)
AST: 52 U/L — AB (ref 15–41)
BILIRUBIN TOTAL: 0.7 mg/dL (ref 0.3–1.2)
Bilirubin, Direct: 0.1 mg/dL (ref 0.1–0.5)
Indirect Bilirubin: 0.6 mg/dL (ref 0.3–0.9)
Total Protein: 5.6 g/dL — ABNORMAL LOW (ref 6.5–8.1)

## 2016-05-16 LAB — GLUCOSE, CAPILLARY
Glucose-Capillary: 123 mg/dL — ABNORMAL HIGH (ref 65–99)
Glucose-Capillary: 103 mg/dL — ABNORMAL HIGH (ref 65–99)

## 2016-05-16 LAB — MAGNESIUM: Magnesium: 1.9 mg/dL (ref 1.7–2.4)

## 2016-05-16 MED ORDER — POTASSIUM CHLORIDE IN NACL 20-0.9 MEQ/L-% IV SOLN
INTRAVENOUS | Status: DC
  Administered 2016-05-16 – 2016-05-18 (×5): via INTRAVENOUS
  Filled 2016-05-16 (×7): qty 1000

## 2016-05-16 MED ORDER — CEPASTAT 14.5 MG MT LOZG
1.0000 | LOZENGE | OROMUCOSAL | Status: DC | PRN
  Filled 2016-05-16: qty 9

## 2016-05-16 MED ORDER — IBUPROFEN 400 MG PO TABS
600.0000 mg | ORAL_TABLET | Freq: Four times a day (QID) | ORAL | Status: DC | PRN
  Administered 2016-05-16 – 2016-05-18 (×6): 600 mg via ORAL
  Filled 2016-05-16 (×7): qty 2

## 2016-05-16 NOTE — Progress Notes (Signed)
[facility] [place] NAME: Yosif Paules    MR#:  [id number]  DATE OF BIRTH:  [birth year]  SUBJECTIVE:  CHIEF COMPLAINT:   Chief Complaint  Patient presents with  . Dizziness  . Wound Infection  Patient is 65 year old Caucasian male with past medical history significant for history of shoulder ultra scabietic, tonsillectomy, who presents to the hospital with fevers, dizziness, weakness. Apparently patient was seen in the emergency room 2 days ago and was discharged home. He complains of sore throat, headache, neck pain for the last 3 days. He was noted to have rash in the right mid axillary area in the chest, also for the past 3 days. He admitted of having some tics taken off His skin recently, none of them were embedded.   Review of Systems  Constitutional: Positive for diaphoresis. Negative for chills, fever and weight loss.  HENT: Positive for sore throat. Negative for congestion.   Eyes: Negative for blurred vision and double vision.  Respiratory: Positive for cough, sputum production and shortness of breath. Negative for wheezing.   Cardiovascular: Positive for chest pain. Negative for palpitations, orthopnea, leg swelling and PND.  Gastrointestinal: Negative for abdominal pain, blood in stool, constipation, diarrhea, nausea and vomiting.  Genitourinary: Negative for dysuria, frequency, hematuria and urgency.  Musculoskeletal: Positive for back pain, joint pain and neck pain. Negative for falls.  Skin: Positive for rash.  Neurological: Positive for weakness. Negative for dizziness, tremors, focal weakness and headaches.  Endo/Heme/Allergies: Does not bruise/bleed easily.  Psychiatric/Behavioral: Negative for depression. The patient does not have insomnia.     VITAL SIGNS: Blood pressure 129/79, pulse 83, temperature 98.5 F (36.9 C), temperature source Oral, resp. rate 18, height 5' 10.5" (1.791 m), weight 81.7 kg (180 lb 1.6  oz), SpO2 94 %.  PHYSICAL EXAMINATION:   GENERAL:  65 y.o.-year-old patient lying in the bed in mild to moderate distress due to pain in the chest, neck, headaches, rash in the right axilla pain/burning sensation.  EYES: Pupils equal, round, reactive to light and accommodation. No scleral icterus. Extraocular muscles intact.  HEENT: Head atraumatic, normocephalic. Oropharynx and nasopharynx clear. No nuchal rigidity NECK:  Supple, no jugular venous distention. No thyroid enlargement, no tenderness.  LUNGS: Normal breath sounds , no wheezing, few scattered rales,rhonchi , and crepitations noted bilaterally at bases. No use of accessory muscles of respiration.  CARDIOVASCULAR: S1, S2 normal. No murmurs, rubs, or gallops.  ABDOMEN: Soft, nontender, nondistended. Bowel sounds present. No organomegaly or mass.  EXTREMITIES: No pedal edema, cyanosis, or clubbing.  NEUROLOGIC: Cranial nerves II through XII are intact. Muscle strength 5/5 in all extremities. Sensation intact. Gait not checked.  PSYCHIATRIC: The patient is alert and oriented x 3.  SKIN: Maculopapular rash in mid thorax at right midaxillary line, some erythematous background. No vesicular formations were noted  ORDERS/RESULTS REVIEWED:   CBC  Recent Labs Lab 05/10/16 2224 05/15/16 1443 05/16/16 0521  WBC 8.5 10.6  10.4 9.8  HGB 14.4 13.9  14.0 13.7  HCT 40.5 39.2*  38.8* 39.1*  PLT 212 185  180 163  MCV 91.9 91.7  90.8 92.8  MCH 32.7 32.5  32.8 32.6  MCHC 35.6 35.5  36.1* 35.1  RDW 13.5 13.5  13.3 13.6  LYMPHSABS  --  1.2  --   MONOABS  --  0.9  --   EOSABS  --  0.1  --   BASOSABS  --  0.0  --    ------------------------------------------------------------------------------------------------------------------  Chemistries   Recent Labs Lab 05/10/16 2224 05/15/16 1443 05/16/16 0521  NA 134* 128* 129*  K 3.5 3.4* 3.4*  CL 98* 95* 96*  CO2 27 25 24   GLUCOSE 108* 124* 130*  BUN 15 10 10   CREATININE 1.14  0.97 0.88  CALCIUM 8.6* 7.9* 7.5*  AST  --  43*  --   ALT  --  42  --   ALKPHOS  --  113  --   BILITOT  --  0.6  --    ------------------------------------------------------------------------------------------------------------------ estimated creatinine clearance is 86.7 mL/min (by C-G formula based on SCr of 0.88 mg/dL). ------------------------------------------------------------------------------------------------------------------ No results for input(s): TSH, T4TOTAL, T3FREE, THYROIDAB in the last 72 hours.  Invalid input(s): FREET3  Cardiac Enzymes  Recent Labs Lab 05/10/16 2224 05/11/16 0121  TROPONINI <0.03 <0.03   ------------------------------------------------------------------------------------------------------------------ Invalid input(s): POCBNP ---------------------------------------------------------------------------------------------------------------  RADIOLOGY: Dg Chest Portable 1 View  Result Date: 05/15/2016 CLINICAL DATA:  65 year old male with history of lightheadedness and dizziness. Elevated blood pressure. EXAM: PORTABLE CHEST 1 VIEW COMPARISON:  Chest x-ray 08/23/2014. FINDINGS: Lung volumes are low. Opacity at the left base favored to reflect subsegmental atelectasis. No definite acute consolidative airspace disease. No pleural effusions. No evidence of pulmonary edema. Heart size is normal. The patient is rotated to the left on today's exam, resulting in distortion of the mediastinal contours and reduced diagnostic sensitivity and specificity for mediastinal pathology. IMPRESSION: 1. Low lung volumes with probable subsegmental atelectasis in the left lower lobe. Electronically Signed   By: Vinnie Langton M.D.   On: 05/15/2016 16:58    EKG:  Orders placed or performed during the hospital encounter of 05/15/16  . EKG 12-Lead  . EKG 12-Lead  . ED EKG  . ED EKG    ASSESSMENT AND PLAN:  Active Problems:   Sepsis due to pneumonia ([place]) #1. Sepsis  due to pneumonia, blood cultures are negative, continue antibiotic therapy with Rocephin and Zithromax, obtain sputum cultures if possible. MAXIMUM TEMPERATURE was 100.2. Get [place] spotted fever, Lyme disease, ehrlichia testing #2. Left lower lobe pneumonia, sputum cultures if possible, adjust antibiotics depending on culture results #3. Rash, questionable shingles, get dermatologist involved for recommendations #4. Hyponatremia, some improvement with IV fluid administration, follow in the morning #5. Hypokalemia, supplement intravenously, check magnesium level and supplement as needed #6. Elevated transaminase level, repeat liver enzymes testing now, get ultrasound or CT of abdomen depending on the results  Management plans discussed with the patient, family and they are in agreement.   DRUG ALLERGIES: No Known Allergies  CODE STATUS:     Code Status Orders        Start     Ordered   05/15/16 1840  Full code  Continuous     05/15/16 1840    Code Status History    Date Active Date Inactive Code Status Order ID Comments User Context   08/24/2014  4:45 AM 08/24/2014  5:16 PM Full Code [id number]  Berle Mull, MD ED      TOTAL TIME TAKING CARE OF THIS PATIENT: 40 minutes.    Theodoro Grist M.D on 05/16/2016 at 2:42 PM  Between 7am to 6pm - Pager - [phone]  After 6pm go to [email or web address] - password [email or web address] [facility]  Office  [phone]  CC: Primary care physician; Madelyn Brunner, MD

## 2016-05-16 NOTE — Progress Notes (Signed)
Patient resting in bed, wife at bedside. C/O swelling to face, denies any SOB. Will continue to monitor and pass on to night shift nurse.

## 2016-05-16 NOTE — Care Management (Signed)
Patient seen in ED 2 days prior and sent home then returns and admitted  with sx concerning  for sepsis, fatigue and blood pressure 220/120.   Lymes is being ruled out.  Independent in all adls, denies issues accessing medical care, obtaining medications or with transportation.  Current with  PCP.  No discharge needs identified at present by care manager or members of care team

## 2016-05-16 NOTE — Care Management Important Message (Signed)
Important Message  Patient Details  Name: Dale [name] MRN: [id number] Date of Birth: [birth year]   Medicare Important Message Given:  Yes    Katrina Stack, RN 05/16/2016, 9:59 AM

## 2016-05-16 NOTE — Consult Note (Signed)
[facility] Infectious Disease     Reason for Consult: Fever, rash    Referring Physician: Theodoro Grist Date of Admission:  05/15/2016   Active Problems:   Sepsis due to pneumonia [facility])   HPI: Dale [name] is a 65 y.o. male admitted 8/6 with fevers, ST, HA, neck pain and fatigue. Also developed a rash in axilla, dry cough, aphthous ulceration.  He was seen 8/2 in ED with fever and elevated BP.  He had nml wbc at that time, bp improved and he was discharged without abx.   Since admission he has been febrile to > 101.  He has felt hot and sweaty, had some headaches persisting, some improvement in sxs with tylenol He has removed ticks recently but none imbeded. Has camped a lot, has pet dogs next door at his sons. Is around grandkids but no sick contacts History reviewed. No pertinent past medical history. Past Surgical History:  Procedure Laterality Date  . SHOULDER ARTHROSCOPY    . TONSILLECTOMY     Social History  Substance Use Topics  . Smoking status: Never Smoker  . Smokeless tobacco: Never Used  . Alcohol use Yes     Comment: occasionally   History reviewed. No pertinent family history.  Allergies: No Known Allergies  Current antibiotics: Antibiotics Given (last 72 hours)    Date/Time Action Medication Dose Rate   05/15/16 2208 Given   doxycycline (VIBRA-TABS) tablet 100 mg 100 mg    05/15/16 2208 Given   cefTRIAXone (ROCEPHIN) 1 g in dextrose 5 % 50 mL IVPB 1 g 100 mL/hr   05/15/16 2208 Given   azithromycin (ZITHROMAX) 500 mg in dextrose 5 % 250 mL IVPB 500 mg 250 mL/hr   05/16/16 0946 Given   doxycycline (VIBRA-TABS) tablet 100 mg 100 mg       MEDICATIONS: . aspirin EC  81 mg Oral Daily  . cefTRIAXone (ROCEPHIN)  IV  1 g Intravenous Q24H  . doxycycline  100 mg Oral Q12H  . enoxaparin (LOVENOX) injection  40 mg Subcutaneous Q24H  . pantoprazole  40 mg Oral Daily    Review of Systems - 11 systems reviewed and negative per HPI   OBJECTIVE: Temp:   [97.8 F (36.6 C)-101.2 F (38.4 C)] 98.5 F (36.9 C) (08/07 1126) Pulse Rate:  [83-103] 83 (08/07 1126) Resp:  [16-35] 18 (08/07 1126) BP: (98-129)/(66-83) 129/79 (08/07 1126) SpO2:  [91 %-94 %] 94 % (08/07 1126) Weight:  [81.7 kg (180 lb 1.6 oz)] 81.7 kg (180 lb 1.6 oz) (08/06 2059) Physical Exam  Constitutional: He is oriented to person, place, and time. He appears mildly ill HENT: anicteric, mild injection  Mild worsening of post neck pain with flexion Mouth/Throat: Oropharynx is clear and dry. Aphthous ulceration lower lip. No oropharyngeal exudate.  Cardiovascular: Normal rate, regular rhythm and normal heart sounds.Pulmonary/Chest: Effort normal and breath sounds normal. No respiratory distress. He has no wheezes.  Abdominal: Soft. Bowel sounds are normal. He exhibits no distension. There is no tenderness.  Lymphadenopathy: He has no cervical adenopathy.  Neurological: He is alert and oriented to person, place, and time.  Skin: Skin is warm and dry. R axilla with large patch y of faint pink papules Psychiatric: He has a normal mood and affect. His behavior is normal.   LABS: Results for orders placed or performed during the hospital encounter of 05/15/16 (from the past 48 hour(s))  Basic metabolic panel     Status: Abnormal   Collection Time: 05/15/16  2:43 PM  Result Value Ref Range   Sodium 128 (L) 135 - 145 mmol/L   Potassium 3.4 (L) 3.5 - 5.1 mmol/L   Chloride 95 (L) 101 - 111 mmol/L   CO2 25 22 - 32 mmol/L   Glucose, Bld 124 (H) 65 - 99 mg/dL   BUN 10 6 - 20 mg/dL   Creatinine, Ser 0.97 0.61 - 1.24 mg/dL   Calcium 7.9 (L) 8.9 - 10.3 mg/dL   GFR calc non Af Amer >60 >60 mL/min   GFR calc Af Amer >60 >60 mL/min    Comment: (NOTE) The eGFR has been calculated using the CKD EPI equation. This calculation has not been validated in all clinical situations. eGFR's persistently <60 mL/min signify possible Chronic Kidney Disease.    Anion gap 8 5 - 15  CBC     Status:  Abnormal   Collection Time: 05/15/16  2:43 PM  Result Value Ref Range   WBC 10.6 3.8 - 10.6 K/uL    Comment: DUPLICATE ADDED ON CBC WITH DIFF    RBC 4.27 (L) 4.40 - 5.90 MIL/uL   Hemoglobin 13.9 13.0 - 18.0 g/dL   HCT 39.2 (L) 40.0 - 52.0 %   MCV 91.7 80.0 - 100.0 fL   MCH 32.5 26.0 - 34.0 pg   MCHC 35.5 32.0 - 36.0 g/dL   RDW 13.5 11.5 - 14.5 %   Platelets 185 150 - 440 K/uL  CBC WITH DIFFERENTIAL     Status: Abnormal   Collection Time: 05/15/16  2:43 PM  Result Value Ref Range   WBC 10.4 3.8 - 10.6 K/uL   RBC 4.27 (L) 4.40 - 5.90 MIL/uL   Hemoglobin 14.0 13.0 - 18.0 g/dL    Comment: RESULT REPEATED AND VERIFIED   HCT 38.8 (L) 40.0 - 52.0 %   MCV 90.8 80.0 - 100.0 fL   MCH 32.8 26.0 - 34.0 pg   MCHC 36.1 (H) 32.0 - 36.0 g/dL   RDW 13.3 11.5 - 14.5 %   Platelets 180 150 - 440 K/uL   Neutrophils Relative % 79% %   Neutro Abs 8.2 (H) 1.4 - 6.5 K/uL   Lymphocytes Relative 12% %   Lymphs Abs 1.2 1.0 - 3.6 K/uL   Monocytes Relative 8% %   Monocytes Absolute 0.9 0.2 - 1.0 K/uL   Eosinophils Relative 1% %   Eosinophils Absolute 0.1 0 - 0.7 K/uL   Basophils Relative 0% %   Basophils Absolute 0.0 0 - 0.1 K/uL  Hepatic function panel     Status: Abnormal   Collection Time: 05/15/16  2:43 PM  Result Value Ref Range   Total Protein 6.1 (L) 6.5 - 8.1 g/dL   Albumin 3.3 (L) 3.5 - 5.0 g/dL   AST 43 (H) 15 - 41 U/L   ALT 42 17 - 63 U/L   Alkaline Phosphatase 113 38 - 126 U/L   Total Bilirubin 0.6 0.3 - 1.2 mg/dL   Bilirubin, Direct 0.2 0.1 - 0.5 mg/dL   Indirect Bilirubin 0.4 0.3 - 0.9 mg/dL  POCT rapid strep A [facility])     Status: None   Collection Time: 05/15/16  4:19 PM  Result Value Ref Range   Streptococcus, Group A Screen (Direct) NEGATIVE NEGATIVE  Urinalysis complete, with microscopic     Status: Abnormal   Collection Time: 05/15/16  4:30 PM  Result Value Ref Range   Color, Urine YELLOW (A) YELLOW   APPearance CLEAR (A) CLEAR  Glucose, UA NEGATIVE NEGATIVE  mg/dL   Bilirubin Urine NEGATIVE NEGATIVE   Ketones, ur TRACE (A) NEGATIVE mg/dL   Specific Gravity, Urine 1.014 1.005 - 1.030   Hgb urine dipstick NEGATIVE NEGATIVE   pH 7.0 5.0 - 8.0   Protein, ur NEGATIVE NEGATIVE mg/dL   Nitrite NEGATIVE NEGATIVE   Leukocytes, UA NEGATIVE NEGATIVE   RBC / HPF 0-5 0 - 5 RBC/hpf   WBC, UA 0-5 0 - 5 WBC/hpf   Bacteria, UA NONE SEEN NONE SEEN   Squamous Epithelial / LPF NONE SEEN NONE SEEN  Blood Culture (routine x 2)     Status: None (Preliminary result)   Collection Time: 05/15/16  4:30 PM  Result Value Ref Range   Specimen Description BLOOD LEFT WRIST    Special Requests BOTTLES DRAWN AEROBIC AND ANAEROBIC 1CC    Culture NO GROWTH < 24 HOURS    Report Status PENDING   Blood Culture (routine x 2)     Status: None (Preliminary result)   Collection Time: 05/15/16  4:31 PM  Result Value Ref Range   Specimen Description BLOOD RIGHT ARM    Special Requests BOTTLES DRAWN AEROBIC AND ANAEROBIC 5CC    Culture NO GROWTH < 24 HOURS    Report Status PENDING   Lactic acid, plasma     Status: None   Collection Time: 05/15/16  4:31 PM  Result Value Ref Range   Lactic Acid, Venous 1.5 0.5 - 1.9 mmol/L  Basic metabolic panel     Status: Abnormal   Collection Time: 05/16/16  5:21 AM  Result Value Ref Range   Sodium 129 (L) 135 - 145 mmol/L   Potassium 3.4 (L) 3.5 - 5.1 mmol/L   Chloride 96 (L) 101 - 111 mmol/L   CO2 24 22 - 32 mmol/L   Glucose, Bld 130 (H) 65 - 99 mg/dL   BUN 10 6 - 20 mg/dL   Creatinine, Ser 0.88 0.61 - 1.24 mg/dL   Calcium 7.5 (L) 8.9 - 10.3 mg/dL   GFR calc non Af Amer >60 >60 mL/min   GFR calc Af Amer >60 >60 mL/min    Comment: (NOTE) The eGFR has been calculated using the CKD EPI equation. This calculation has not been validated in all clinical situations. eGFR's persistently <60 mL/min signify possible Chronic Kidney Disease.    Anion gap 9 5 - 15  CBC     Status: Abnormal   Collection Time: 05/16/16  5:21 AM  Result Value  Ref Range   WBC 9.8 3.8 - 10.6 K/uL   RBC 4.21 (L) 4.40 - 5.90 MIL/uL   Hemoglobin 13.7 13.0 - 18.0 g/dL   HCT 39.1 (L) 40.0 - 52.0 %   MCV 92.8 80.0 - 100.0 fL   MCH 32.6 26.0 - 34.0 pg   MCHC 35.1 32.0 - 36.0 g/dL   RDW 13.6 11.5 - 14.5 %   Platelets 163 150 - 440 K/uL  Glucose, capillary     Status: Abnormal   Collection Time: 05/16/16  7:33 AM  Result Value Ref Range   Glucose-Capillary 123 (H) 65 - 99 mg/dL  Glucose, capillary     Status: Abnormal   Collection Time: 05/16/16 11:25 AM  Result Value Ref Range   Glucose-Capillary 103 (H) 65 - 99 mg/dL   No components found for: ESR, C REACTIVE PROTEIN MICRO: Recent Results (from the past 720 hour(s))  Blood Culture (routine x 2)     Status: None (Preliminary result)  Collection Time: 05/15/16  4:30 PM  Result Value Ref Range Status   Specimen Description BLOOD LEFT WRIST  Final   Special Requests BOTTLES DRAWN AEROBIC AND ANAEROBIC 1CC  Final   Culture NO GROWTH < 24 HOURS  Final   Report Status PENDING  Incomplete  Blood Culture (routine x 2)     Status: None (Preliminary result)   Collection Time: 05/15/16  4:31 PM  Result Value Ref Range Status   Specimen Description BLOOD RIGHT ARM  Final   Special Requests BOTTLES DRAWN AEROBIC AND ANAEROBIC 5CC  Final   Culture NO GROWTH < 24 HOURS  Final   Report Status PENDING  Incomplete    IMAGING: Dg Chest Portable 1 View  Result Date: 05/15/2016 CLINICAL DATA:  65 year old male with history of lightheadedness and dizziness. Elevated blood pressure. EXAM: PORTABLE CHEST 1 VIEW COMPARISON:  Chest x-ray 08/23/2014. FINDINGS: Lung volumes are low. Opacity at the left base favored to reflect subsegmental atelectasis. No definite acute consolidative airspace disease. No pleural effusions. No evidence of pulmonary edema. Heart size is normal. The patient is rotated to the left on today's exam, resulting in distortion of the mediastinal contours and reduced diagnostic sensitivity and  specificity for mediastinal pathology. IMPRESSION: 1. Low lung volumes with probable subsegmental atelectasis in the left lower lobe. Electronically Signed   By: Vinnie Langton M.D.   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  + tick bites recently. Had recent R great toe ingrown toenail but resolved without abx.  No stigmata of endocarditis.  I suspect tick borne illness vs viral infection such as EBV, CMV or enterovirus.  He is not septic and WBC nml. GAS screen neg, bcx neg, ua and ucx negative. cxr negative.   Recommendations Continue ceftriaxone and doxy Await tick serologies. If HA neck pain worsens can consider LP but no need at this time.  Can add ibuprofen prn as well as tylenol Will follow  Thank you very much for allowing me to participate in the care of this patient. Please call with questions.   Cheral Marker. Ola Spurr, MD

## 2016-05-17 LAB — URINE CULTURE: Culture: NO GROWTH

## 2016-05-17 LAB — B. BURGDORFI ANTIBODIES
B burgdorferi Ab IgG+IgM: <0.91 {ISR} (ref 0.00–0.90)
B burgdorferi Ab IgG+IgM: <0.91 {ISR} (ref 0.00–0.90)

## 2016-05-17 LAB — GLUCOSE, CAPILLARY: GLUCOSE-CAPILLARY: 107 mg/dL — AB (ref 65–99)

## 2016-05-17 MED ORDER — ENSURE ENLIVE PO LIQD
237.0000 mL | Freq: Two times a day (BID) | ORAL | Status: DC
  Administered 2016-05-17 – 2016-05-18 (×2): 237 mL via ORAL

## 2016-05-17 NOTE — Progress Notes (Signed)
[facility] [place] NAME: Dale [name]    MR#:  [id number]  DATE OF BIRTH:  1951/03/14  SUBJECTIVE:  CHIEF COMPLAINT:   Chief Complaint  Patient presents with  . Dizziness  . Wound Infection  Patient is 65 year old Caucasian male with past medical history significant for history of shoulder ultra scabietic, tonsillectomy, who presents to the hospital with fevers, dizziness, weakness. Apparently patient was seen in the emergency room 2 days ago and was discharged home. He complains of sore throat, headache, neck pain for the last 3 days. He was noted to have rash in the right mid axillary area in the chest, also for the past 3 days. He admitted of having some tics taken off His skin recently, none of them were embedded.  The patient feels better today, denies any chest pains, admits of headaches. Fever was 101/6 2017, none over the past 36 -48 hours. Rash is subsiding, overall, patient feels more comfortable   Review of Systems  Constitutional: Positive for diaphoresis. Negative for chills, fever and weight loss.  HENT: Positive for sore throat. Negative for congestion.   Eyes: Negative for blurred vision and double vision.  Respiratory: Positive for cough, sputum production and shortness of breath. Negative for wheezing.   Cardiovascular: Positive for chest pain. Negative for palpitations, orthopnea, leg swelling and PND.  Gastrointestinal: Negative for abdominal pain, blood in stool, constipation, diarrhea, nausea and vomiting.  Genitourinary: Negative for dysuria, frequency, hematuria and urgency.  Musculoskeletal: Positive for back pain, joint pain and neck pain. Negative for falls.  Skin: Positive for rash.  Neurological: Positive for weakness. Negative for dizziness, tremors, focal weakness and headaches.  Endo/Heme/Allergies: Does not bruise/bleed easily.  Psychiatric/Behavioral: Negative for depression. The patient does not  have insomnia.     VITAL SIGNS: Blood pressure 127/76, pulse 89, temperature 98.3 F (36.8 C), temperature source Oral, resp. rate 17, height 5' 10.5" (1.791 m), weight 80.5 kg (177 lb 7.5 oz), SpO2 93 %.  PHYSICAL EXAMINATION:   GENERAL:  65 y.o.-year-old patient lying in the bed in mild discomfort  due to  Headaches EYES: Pupils equal, round, reactive to light and accommodation. No scleral icterus. Extraocular muscles intact.  HEENT: Head atraumatic, normocephalic. Oropharynx and nasopharynx clear. No nuchal rigidity NECK:  Supple, no jugular venous distention. No thyroid enlargement, no tenderness.  LUNGS: Normal breath sounds , no wheezing,. No rales,rhonchi , and crepitations noted bilaterally at bases. No use of accessory muscles of respiration.  CARDIOVASCULAR: S1, S2 normal. No murmurs, rubs, or gallops.  ABDOMEN: Soft, nontender, nondistended. Bowel sounds present. No organomegaly or mass.  EXTREMITIES: No pedal edema, cyanosis, or clubbing.  NEUROLOGIC: Cranial nerves II through XII are intact. Muscle strength 5/5 in all extremities. Sensation intact. Gait not checked.  PSYCHIATRIC: The patient is alert and oriented x 3.  SKIN: Maculopapular rash in mid thorax at right midaxillary line , resolving     ORDERS/RESULTS REVIEWED:   CBC  Recent Labs Lab 05/10/16 2224 05/15/16 1443 05/16/16 0521  WBC 8.5 10.6  10.4 9.8  HGB 14.4 13.9  14.0 13.7  HCT 40.5 39.2*  38.8* 39.1*  PLT 212 185  180 163  MCV 91.9 91.7  90.8 92.8  MCH 32.7 32.5  32.8 32.6  MCHC 35.6 35.5  36.1* 35.1  RDW 13.5 13.5  13.3 13.6  LYMPHSABS  --  1.2  --   MONOABS  --  0.9  --   EOSABS  --  0.1  --   BASOSABS  --  0.0  --    ------------------------------------------------------------------------------------------------------------------  Chemistries   Recent Labs Lab 05/10/16 2224 05/15/16 1443 05/16/16 0521 05/16/16 2215  NA 134* 128* 129* 131*  K 3.5 3.4* 3.4* 3.3*  CL 98* 95*  96* 97*  CO2 27 25 24 27   GLUCOSE 108* 124* 130* 121*  BUN 15 10 10 10   CREATININE 1.14 0.97 0.88 0.83  CALCIUM 8.6* 7.9* 7.5* 7.7*  MG  --   --  1.9  --   AST  --  43* 52* 22  ALT  --  42 54 43  ALKPHOS  --  113 126 116  BILITOT  --  0.6 0.7 0.6   ------------------------------------------------------------------------------------------------------------------ estimated creatinine clearance is 91.9 mL/min (by C-G formula based on SCr of 0.83 mg/dL). ------------------------------------------------------------------------------------------------------------------ No results for input(s): TSH, T4TOTAL, T3FREE, THYROIDAB in the last 72 hours.  Invalid input(s): FREET3  Cardiac Enzymes  Recent Labs Lab 05/10/16 2224 05/11/16 0121  TROPONINI <0.03 <0.03   ------------------------------------------------------------------------------------------------------------------ Invalid input(s): POCBNP ---------------------------------------------------------------------------------------------------------------  RADIOLOGY: Dg Chest Portable 1 View  Result Date: 05/15/2016 CLINICAL DATA:  65 year old male with history of lightheadedness and dizziness. Elevated blood pressure. EXAM: PORTABLE CHEST 1 VIEW COMPARISON:  Chest x-ray 08/23/2014. FINDINGS: Lung volumes are low. Opacity at the left base favored to reflect subsegmental atelectasis. No definite acute consolidative airspace disease. No pleural effusions. No evidence of pulmonary edema. Heart size is normal. The patient is rotated to the left on today's exam, resulting in distortion of the mediastinal contours and reduced diagnostic sensitivity and specificity for mediastinal pathology. IMPRESSION: 1. Low lung volumes with probable subsegmental atelectasis in the left lower lobe. Electronically Signed   By: Vinnie Langton M.D.   On: 05/15/2016 16:58    EKG:  Orders placed or performed during the hospital encounter of 05/15/16  . EKG  12-Lead  . EKG 12-Lead  . ED EKG  . ED EKG    ASSESSMENT AND PLAN:  Active Problems:   Sepsis due to pneumonia ([place]) #1. Sepsis due toTickborne infection rather than pneumonia, per ID, blood cultures are negative, continue antibiotic therapy with Rocephin and doxycycline, patient was advised to continue antibiotic therapy for 5 more days to complete course and follow-up with ID as outpatient.  [place] fever, Lyme disease, ehrlichia test results are still pending .  #3. Rash,suspected perspiration  versus skin irritation related,, improved. With conservative therapy  #4. Hyponatremia, improved  with IV fluid administration, follow in the morning #5. Hypokalemia, supplementing intravenously, magnesium level was checked and was found to be normal  #6. Elevated transaminase level, , resolved on repeated liver enzymes testing, CMV, EBV testing is pending  Management plans discussed with the patient, family and they are in agreement.   DRUG ALLERGIES: No Known Allergies  CODE STATUS:     Code Status Orders        Start     Ordered   05/15/16 1840  Full code  Continuous     05/15/16 1840    Code Status History    Date Active Date Inactive Code Status Order ID Comments User Context   08/24/2014  4:45 AM 08/24/2014  5:16 PM Full Code [id number]  Berle Mull, MD ED      TOTAL TIME TAKING CARE OF THIS PATIENT: 40 minutes.  Discussed with patient's wife, all questions were answered, voiced understanding  Radames Mejorado M.D on 05/17/2016 at 2:38 PM  Between 7am to 6pm - Pager -  [phone]  After 6pm go to [email or web address] - password [email or web address] [facility]  Office  [phone]  CC: Primary care physician; Madelyn Brunner, MD

## 2016-05-17 NOTE — Progress Notes (Signed)
Alert and oriented. Still complaining of headache and neck pain. Patient does not want narcotics because they make him nauseous. Alternating between tylenol and ibuprofen, which the patient states is helping him. Continuing ice pack to chest and head. Vitals are stable. Still awaiting tick born lab results.

## 2016-05-17 NOTE — Progress Notes (Signed)
[place] INFECTIOUS DISEASE PROGRESS NOTE Date of Admission:  05/15/2016     ID: WOFFORD BILLOCK is a 65 y.o. male with fever, HA rash Active Problems:   Sepsis due to pneumonia [facility])   Subjective: Feels a little better, no fevers, less HA, no neck pain any longer. Got up oob briefly  ROS  Eleven systems are reviewed and negative except per hpi  Medications:  Antibiotics Given (last 72 hours)    Date/Time Action Medication Dose Rate   05/15/16 2208 Given   doxycycline (VIBRA-TABS) tablet 100 mg 100 mg    05/15/16 2208 Given   cefTRIAXone (ROCEPHIN) 1 g in dextrose 5 % 50 mL IVPB 1 g 100 mL/hr   05/15/16 2208 Given   azithromycin (ZITHROMAX) 500 mg in dextrose 5 % 250 mL IVPB 500 mg 250 mL/hr   05/16/16 0946 Given   doxycycline (VIBRA-TABS) tablet 100 mg 100 mg    05/16/16 2053 Given   doxycycline (VIBRA-TABS) tablet 100 mg 100 mg    05/16/16 2053 Given   cefTRIAXone (ROCEPHIN) 1 g in dextrose 5 % 50 mL IVPB 1 g 100 mL/hr   05/17/16 0848 Given   doxycycline (VIBRA-TABS) tablet 100 mg 100 mg      . aspirin EC  81 mg Oral Daily  . cefTRIAXone (ROCEPHIN)  IV  1 g Intravenous Q24H  . doxycycline  100 mg Oral Q12H  . enoxaparin (LOVENOX) injection  40 mg Subcutaneous Q24H  . feeding supplement (ENSURE ENLIVE)  237 mL Oral BID BM  . pantoprazole  40 mg Oral Daily    Objective: Vital signs in last 24 hours: Temp:  [97.8 F (36.6 C)-98.3 F (36.8 C)] 98.3 F (36.8 C) (08/08 1219) Pulse Rate:  [89-91] 89 (08/08 1219) Resp:  [15-17] 17 (08/08 1219) BP: (123-128)/(76-85) 127/76 (08/08 1219) SpO2:  [87 %-93 %] 93 % (08/08 1219) Weight:  [80.5 kg (177 lb 7.5 oz)] 80.5 kg (177 lb 7.5 oz) (08/08 0433) Constitutional: He is oriented to person, place, and time. He appears mildly ill HENT: anicteric, mild injection  Mild worsening of post neck pain with flexion Mouth/Throat: Oropharynx is clear and dry. Aphthous ulceration lower lip. No oropharyngeal exudate.   Cardiovascular: Normal rate, regular rhythm and normal heart sounds.Pulmonary/Chest: Effort normal and breath sounds normal. No respiratory distress. He has no wheezes.  Abdominal: Soft. Bowel sounds are normal. He exhibits no distension. There is no tenderness.  Lymphadenopathy: He has no cervical adenopathy.  Neurological: He is alert and oriented to person, place, and time.  Skin: Skin is warm and dry. R axilla with large patch y of faint pink papules Psychiatric: He has a normal mood and affect. His behavior is normal.   Lab Results  Recent Labs  05/15/16 1443 05/16/16 0521 05/16/16 2215  WBC 10.6  10.4 9.8  --   HGB 13.9  14.0 13.7  --   HCT 39.2*  38.8* 39.1*  --   NA 128* 129* 131*  K 3.4* 3.4* 3.3*  CL 95* 96* 97*  CO2 25 24 27   BUN 10 10 10   CREATININE 0.97 0.88 0.83    Microbiology: Results for orders placed or performed during the hospital encounter of 05/15/16  Blood Culture (routine x 2)     Status: None (Preliminary result)   Collection Time: 05/15/16  4:30 PM  Result Value Ref Range Status   Specimen Description BLOOD LEFT WRIST  Final   Special Requests BOTTLES DRAWN AEROBIC AND ANAEROBIC 1CC  Final   Culture NO GROWTH < 24 HOURS  Final   Report Status PENDING  Incomplete  Urine culture     Status: None   Collection Time: 05/15/16  4:30 PM  Result Value Ref Range Status   Specimen Description URINE, RANDOM  Final   Special Requests NONE  Final   Culture NO GROWTH Performed at [facility]   Final   Report Status 05/17/2016 FINAL  Final  Blood Culture (routine x 2)     Status: None (Preliminary result)   Collection Time: 05/15/16  4:31 PM  Result Value Ref Range Status   Specimen Description BLOOD RIGHT ARM  Final   Special Requests BOTTLES DRAWN AEROBIC AND ANAEROBIC 5CC  Final   Culture NO GROWTH < 24 HOURS  Final   Report Status PENDING  Incomplete    Studies/Results: Dg Chest Portable 1 View  Result Date: 05/15/2016 CLINICAL DATA:   65 year old male with history of lightheadedness and dizziness. Elevated blood pressure. EXAM: PORTABLE CHEST 1 VIEW COMPARISON:  Chest x-ray 08/23/2014. FINDINGS: Lung volumes are low. Opacity at the left base favored to reflect subsegmental atelectasis. No definite acute consolidative airspace disease. No pleural effusions. No evidence of pulmonary edema. Heart size is normal. The patient is rotated to the left on today's exam, resulting in distortion of the mediastinal contours and reduced diagnostic sensitivity and specificity for mediastinal pathology. IMPRESSION: 1. Low lung volumes with probable subsegmental atelectasis in the left lower lobe. Electronically Signed   By: Vinnie Langton M.D.   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  + tick bites recently. Had recent R great toe ingrown toenail but resolved without abx.  No stigmata of endocarditis.  I suspect tick borne illness vs viral infection such as EBV, CMV or enterovirus.  He is not septic and WBC nml. GAS screen neg, bcx neg, ua and ucx negative. cxr negative.  8/8 - improving, no fevers Recommendations Continue ceftriaxone and doxy Await tick serologies. Anticipate can dc tomorrow on doxy for another 5 days and I Can see next week in clinic to fu on labs Can add ibuprofen prn as well as tylenol Will follow Thank you very much for the consult. Will follow with you.  [place], Georgi P   05/17/2016, 2:04 PM

## 2016-05-18 DIAGNOSIS — A938 Other specified arthropod-borne viral fevers: Secondary | ICD-10-CM

## 2016-05-18 DIAGNOSIS — R7401 Elevation of levels of liver transaminase levels: Secondary | ICD-10-CM

## 2016-05-18 DIAGNOSIS — R740 Nonspecific elevation of levels of transaminase and lactic acid dehydrogenase [LDH]: Secondary | ICD-10-CM

## 2016-05-18 DIAGNOSIS — E876 Hypokalemia: Secondary | ICD-10-CM

## 2016-05-18 DIAGNOSIS — K1379 Other lesions of oral mucosa: Secondary | ICD-10-CM

## 2016-05-18 LAB — EHRLICHIA ANTIBODY PANEL
E CHAFFEENSIS AB, IGG: NEGATIVE
E CHAFFEENSIS AB, IGM: NEGATIVE
E. CHAFFEENSIS (HME) IGM TITER: NEGATIVE
E.Chaffeensis (HME) IgG: NEGATIVE

## 2016-05-18 LAB — CBC
HCT: 39.5 % — ABNORMAL LOW (ref 40.0–52.0)
Hemoglobin: 14 g/dL (ref 13.0–18.0)
MCH: 32.8 pg (ref 26.0–34.0)
MCHC: 35.5 g/dL (ref 32.0–36.0)
MCV: 92.3 fL (ref 80.0–100.0)
PLATELETS: 222 10*3/uL (ref 150–440)
RBC: 4.28 MIL/uL — ABNORMAL LOW (ref 4.40–5.90)
RDW: 13.5 % (ref 11.5–14.5)
WBC: 8.8 10*3/uL (ref 3.8–10.6)

## 2016-05-18 LAB — BASIC METABOLIC PANEL
Anion gap: 9 (ref 5–15)
BUN: 8 mg/dL (ref 6–20)
CALCIUM: 7.6 mg/dL — AB (ref 8.9–10.3)
CO2: 24 mmol/L (ref 22–32)
CREATININE: 0.73 mg/dL (ref 0.61–1.24)
Chloride: 98 mmol/L — ABNORMAL LOW (ref 101–111)
Glucose, Bld: 106 mg/dL — ABNORMAL HIGH (ref 65–99)
Potassium: 3.4 mmol/L — ABNORMAL LOW (ref 3.5–5.1)
SODIUM: 131 mmol/L — AB (ref 135–145)

## 2016-05-18 LAB — ROCKY MTN SPOTTED FVR ABS PNL(IGG+IGM)
RMSF IGG: POSITIVE — AB
RMSF IGM: 0.53 {index} (ref 0.00–0.89)

## 2016-05-18 LAB — GLUCOSE, CAPILLARY: GLUCOSE-CAPILLARY: 140 mg/dL — AB (ref 65–99)

## 2016-05-18 LAB — RMSF, IGG, IFA: RMSF, IGG, IFA: 1:256 {titer} — ABNORMAL HIGH

## 2016-05-18 MED ORDER — ORPHENADRINE CITRATE ER 100 MG PO TB12: 100.0000 mg | ORAL_TABLET | Freq: Two times a day (BID) | ORAL | 0 refills | Status: DC

## 2016-05-18 MED ORDER — ORPHENADRINE CITRATE ER 100 MG PO TB12
100.0000 mg | ORAL_TABLET | Freq: Two times a day (BID) | ORAL | Status: DC
  Filled 2016-05-18 (×2): qty 1

## 2016-05-18 MED ORDER — CEPASTAT 14.5 MG MT LOZG: 1.0000 | LOZENGE | OROMUCOSAL | 0 refills | Status: DC | PRN

## 2016-05-18 MED ORDER — POTASSIUM CHLORIDE CRYS ER 20 MEQ PO TBCR: 40.0000 meq | EXTENDED_RELEASE_TABLET | Freq: Four times a day (QID) | ORAL | Status: DC

## 2016-05-18 MED ORDER — MAGIC MOUTHWASH
5.0000 mL | Freq: Four times a day (QID) | ORAL | Status: DC | PRN
  Filled 2016-05-18: qty 5

## 2016-05-18 MED ORDER — MAGIC MOUTHWASH: 5.0000 mL | Freq: Four times a day (QID) | ORAL | 0 refills | Status: DC | PRN

## 2016-05-18 MED ORDER — POTASSIUM CHLORIDE CRYS ER 20 MEQ PO TBCR
40.0000 meq | EXTENDED_RELEASE_TABLET | Freq: Four times a day (QID) | ORAL | Status: DC
  Administered 2016-05-18: 40 meq via ORAL
  Filled 2016-05-18: qty 2

## 2016-05-18 MED ORDER — DOXYCYCLINE HYCLATE 100 MG PO TABS: 100.0000 mg | ORAL_TABLET | Freq: Two times a day (BID) | ORAL | 0 refills | Status: DC

## 2016-05-18 NOTE — Progress Notes (Signed)
[place] INFECTIOUS DISEASE PROGRESS NOTE Date of Admission:  05/15/2016     ID: Dale [name] is a 65 y.o. male with fever, HA rash Principal Problem:   Tick borne fever Active Problems:   Rash and nonspecific skin eruption   Hypokalemia   Elevated transaminase level   Mouth sore   Subjective: Feels a little better, no fevers, less HA, no neck pain any longer.   ROS  Eleven systems are reviewed and negative except per hpi  Medications:  Antibiotics Given (last 72 hours)    Date/Time Action Medication Dose Rate   05/15/16 2208 Given   doxycycline (VIBRA-TABS) tablet 100 mg 100 mg    05/15/16 2208 Given   cefTRIAXone (ROCEPHIN) 1 g in dextrose 5 % 50 mL IVPB 1 g 100 mL/hr   05/15/16 2208 Given   azithromycin (ZITHROMAX) 500 mg in dextrose 5 % 250 mL IVPB 500 mg 250 mL/hr   05/16/16 0946 Given   doxycycline (VIBRA-TABS) tablet 100 mg 100 mg    05/16/16 2053 Given   doxycycline (VIBRA-TABS) tablet 100 mg 100 mg    05/16/16 2053 Given   cefTRIAXone (ROCEPHIN) 1 g in dextrose 5 % 50 mL IVPB 1 g 100 mL/hr   05/17/16 0848 Given   doxycycline (VIBRA-TABS) tablet 100 mg 100 mg    05/17/16 2057 Given   doxycycline (VIBRA-TABS) tablet 100 mg 100 mg    05/17/16 2058 Given   cefTRIAXone (ROCEPHIN) 1 g in dextrose 5 % 50 mL IVPB 1 g 100 mL/hr   05/18/16 [id number] Given   doxycycline (VIBRA-TABS) tablet 100 mg 100 mg      . aspirin EC  81 mg Oral Daily  . cefTRIAXone (ROCEPHIN)  IV  1 g Intravenous Q24H  . doxycycline  100 mg Oral Q12H  . enoxaparin (LOVENOX) injection  40 mg Subcutaneous Q24H  . feeding supplement (ENSURE ENLIVE)  237 mL Oral BID BM  . orphenadrine  100 mg Oral BID  . pantoprazole  40 mg Oral Daily  . potassium chloride  40 mEq Oral Q6H    Objective: Vital signs in last 24 hours: Temp:  [98 F (36.7 C)-99 F (37.2 C)] 98.1 F (36.7 C) (08/09 1152) Pulse Rate:  [83-88] 83 (08/09 1152) Resp:  [16-18] 18 (08/09 1152) BP: (123-149)/(69-88) 149/88  (08/09 1152) SpO2:  [92 %-94 %] 94 % (08/09 1152) Weight:  [84.5 kg (186 lb 4.8 oz)] 84.5 kg (186 lb 4.8 oz) (08/09 [id number]) Constitutional: He is oriented to person, place, and time. He appears mildly ill HENT: anicteric, mild injection  Mild worsening of post neck pain with flexion Mouth/Throat: Oropharynx is clear and dry. Aphthous ulceration lower lip. No oropharyngeal exudate.  Cardiovascular: Normal rate, regular rhythm and normal heart sounds.Pulmonary/Chest: Effort normal and breath sounds normal. No respiratory distress. He has no wheezes.  Abdominal: Soft. Bowel sounds are normal. He exhibits no distension. There is no tenderness.  Lymphadenopathy: He has no cervical adenopathy.  Neurological: He is alert and oriented to person, place, and time.  Skin: Skin is warm and dry. R axilla with large patch y of faint pink papules Psychiatric: He has a normal mood and affect. His behavior is normal.   Lab Results  Recent Labs  05/16/16 0521 05/16/16 2215 05/18/16 0343  WBC 9.8  --  8.8  HGB 13.7  --  14.0  HCT 39.1*  --  39.5*  NA 129* 131* 131*  K 3.4* 3.3* 3.4*  CL 96* 97*  98*  CO2 24 27 24   BUN 10 10 8   CREATININE 0.88 0.83 0.73    Microbiology: Results for orders placed or performed during the hospital encounter of 05/15/16  Blood Culture (routine x 2)     Status: None (Preliminary result)   Collection Time: 05/15/16  4:30 PM  Result Value Ref Range Status   Specimen Description BLOOD LEFT WRIST  Final   Special Requests BOTTLES DRAWN AEROBIC AND ANAEROBIC 1CC  Final   Culture NO GROWTH 3 DAYS  Final   Report Status PENDING  Incomplete  Urine culture     Status: None   Collection Time: 05/15/16  4:30 PM  Result Value Ref Range Status   Specimen Description URINE, RANDOM  Final   Special Requests NONE  Final   Culture NO GROWTH Performed at [facility]   Final   Report Status 05/17/2016 FINAL  Final  Blood Culture (routine x 2)     Status: None (Preliminary  result)   Collection Time: 05/15/16  4:31 PM  Result Value Ref Range Status   Specimen Description BLOOD RIGHT ARM  Final   Special Requests BOTTLES DRAWN AEROBIC AND ANAEROBIC 5CC  Final   Culture NO GROWTH 3 DAYS  Final   Report Status PENDING  Incomplete    Studies/Results: No results found.  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  + tick bites recently. Had recent R great toe ingrown toenail but resolved without abx.  No stigmata of endocarditis.  I suspect tick borne illness vs viral infection such as EBV, CMV or enterovirus.  He is not septic and WBC nml. GAS screen neg, bcx neg, ua and ucx negative. cxr negative.  8/8 - improving, no fevers 8/9 - no fevers, ha Recommendations Can dc on doxy for another 5 days and I Can see next week in clinic to fu on labs  Will follow Thank you very much for the consult. Will follow with you.  [place], [name]   05/18/2016, 2:54 PM

## 2016-05-18 NOTE — Progress Notes (Signed)
Pt has orders to be discharged. Discharge instructions given and pt has no additional questions at this time. Medication regimen reviewed and pt educated. Pt verbalized understanding and has no additional questions. Telemetry box removed. IV removed and site in good condition. Pt stable and waiting for transportation.   Javan Gonzaga RN 

## 2016-05-18 NOTE — Discharge Summary (Signed)
[facility] [facility] [place] NAME: Dale [name]    MR#:  [id number]  DATE OF BIRTH:  Jul 28, 1951  DATE OF ADMISSION:  05/15/2016 ADMITTING PHYSICIAN: Epifanio Lesches, MD  DATE OF DISCHARGE: 05/18/2016  2:58 PM  PRIMARY CARE PHYSICIAN: Madelyn Brunner, MD     ADMISSION DIAGNOSIS:  Hyponatremia [E87.1] Fever, unspecified fever cause [R50.9]  DISCHARGE DIAGNOSIS:  Principal Problem:   Tick borne fever Active Problems:   Hypokalemia   Elevated transaminase level   Mouth sore   Rash and nonspecific skin eruption   SECONDARY DIAGNOSIS:  History reviewed. No pertinent past medical history.  .pro HOSPITAL COURSE:  Patient is 65 year old Caucasian male with past medical history significant for history of shoulder arthroscopy, tonsillectomy, who presents to the hospital with fevers, dizziness, weakness, headache, neck pain, chest pain. Apparently patient was seen in the emergency room 2 days ago and was discharged home, but came back with complains of sore throat, headache, neck pain for the last 3 days, oral mucosa aphthas. He was noted to have maculopapular rash in the right mid axillary area in the chest, also for the past 3 days. He admitted of having some tics taken off skin recently, none of them were embedded. The patient had an x-ray of the chest done while in the hospital and was noted to have lung atelectasis versus pneumonia, he was initiated on Rocephin and doxycycline to cover tick borne fever, serologic studies for [facility] spotted fever, ehrlichia, and Lyme disease were taken, results are pending. This conservative therapy. Patient's condition improved. His fever subsided, he was felt to be stable to be discharged home. He was seen by infectious disease specialist, Dr. Ola Spurr who recommended to continue doxycycline for 5 more days to complete course and follow-up with him as outpatient. Discussion by problem: #1.  Tickborne infection , blood cultures are negative, continue antibiotic therapy with doxycycline for 5 more days to complete course, per Dr. Ola Spurr,  follow-up with ID as outpatient.  [place] spotted fever, Lyme disease, ehrlichia test results are still pending .  #3. Rash and aphthas of oral mucosa, , improved with conservative therapy , likely related to current infectious process #4. Hyponatremia, improved  with IV fluid administration, follow up as outpatient #5. Hypokalemia, supplemented orally and  intravenously, magnesium level was checked and was found to be normal  #6. Elevated transaminase level, , resolved on repeated liver enzymes testing, CMV, EBV testing is pending    DISCHARGE CONDITIONS:   Stable  CONSULTS OBTAINED:  Treatment Team:  Leonel Ramsay, MD  DRUG ALLERGIES:  No Known Allergies  DISCHARGE MEDICATIONS:   Discharge Medication List as of 05/18/2016  2:19 PM    START taking these medications   Details  doxycycline (VIBRA-TABS) 100 MG tablet Take 1 tablet (100 mg total) by mouth every 12 (twelve) hours., Starting Wed 05/18/2016, Normal    magic mouthwash SOLN Take 5 mLs by mouth 4 (four) times daily as needed for mouth pain., Starting Wed 05/18/2016, Normal    orphenadrine (NORFLEX) 100 MG tablet Take 1 tablet (100 mg total) by mouth 2 (two) times daily., Starting Wed 05/18/2016, Normal    phenol-menthol (CEPASTAT) 14.5 MG lozenge Place 1 lozenge inside cheek as needed for sore throat., Starting Wed 05/18/2016, Normal      CONTINUE these medications which have NOT CHANGED   Details  aspirin EC 81 MG tablet Take 81 mg by mouth every morning. , Historical  Med    meloxicam (MOBIC) 15 MG tablet Take 15 mg by mouth every morning., Historical Med    omeprazole (PRILOSEC) 40 MG capsule Take 40 mg by mouth every morning. , Historical Med    ranitidine (ZANTAC) 150 MG tablet Take 150 mg by mouth at bedtime., Historical Med    sildenafil (REVATIO) 20 MG tablet  Take 20 mg by mouth every morning., Historical Med    Testosterone 20 % CREA Apply 1 mL topically daily., Historical Med    Turmeric 500 MG CAPS Take 500 mg by mouth 2 (two) times daily., Historical Med      STOP taking these medications     azelastine (ASTELIN) 0.1 % nasal spray          DISCHARGE INSTRUCTIONS:    Patient is to follow-up with primary care physician, infectious disease specialist as outpatient  If you experience worsening of your admission symptoms, develop shortness of breath, life threatening emergency, suicidal or homicidal thoughts you must seek medical attention immediately by calling 911 or calling your MD immediately  if symptoms less severe.  You Must read complete instructions/literature along with all the possible adverse reactions/side effects for all the Medicines you take and that have been prescribed to you. Take any new Medicines after you have completely understood and accept all the possible adverse reactions/side effects.   Please note  You were cared for by a hospitalist during your hospital stay. If you have any questions about your discharge medications or the care you received while you were in the hospital after you are discharged, you can call the unit and asked to speak with the hospitalist on call if the hospitalist that took care of you is not available. Once you are discharged, your primary care physician will handle any further medical issues. Please note that NO REFILLS for any discharge medications will be authorized once you are discharged, as it is imperative that you return to your primary care physician (or establish a relationship with a primary care physician if you do not have one) for your aftercare needs so that they can reassess your need for medications and monitor your lab values.    Today   CHIEF COMPLAINT:   Chief Complaint  Patient presents with  . Dizziness  . Wound Infection    HISTORY OF PRESENT ILLNESS:  Dale [name]  is a 65 y.o. male with a known history of shoulder arthroscopy, tonsillectomy, who presents to the hospital with fevers, dizziness, weakness, headache, neck pain, chest pain. Apparently patient was seen in the emergency room 2 days ago and was discharged home, but came back with complains of sore throat, headache, neck pain for the last 3 days, oral mucosa aphthas. He was noted to have maculopapular rash in the right mid axillary area in the chest, also for the past 3 days. He admitted of having some tics taken off skin recently, none of them were embedded. The patient had an x-ray of the chest done while in the hospital and was noted to have lung atelectasis versus pneumonia, he was initiated on Rocephin and doxycycline to cover tick borne fever, serologic studies for [facility] spotted fever, ehrlichia, and Lyme disease were taken, results are pending. This conservative therapy. Patient's condition improved. His fever subsided, he was felt to be stable to be discharged home. He was seen by infectious disease specialist, Dr. Ola Spurr who recommended to continue doxycycline for 5 more days to complete course and follow-up with him as  outpatient. Discussion by problem: #1. Tickborne infection , blood cultures are negative, continue antibiotic therapy with doxycycline for 5 more days to complete course, per Dr. Ola Spurr,  follow-up with ID as outpatient.  [place] spotted fever, Lyme disease, ehrlichia test results are still pending .  #3. Rash and aphthas of oral mucosa, , improved with conservative therapy , likely related to current infectious process #4. Hyponatremia, improved  with IV fluid administration, follow up as outpatient #5. Hypokalemia, supplemented orally and  intravenously, magnesium level was checked and was found to be normal  #6. Elevated transaminase level, , resolved on repeated liver enzymes testing, CMV, EBV testing is pending    VITAL SIGNS:  Blood pressure (!)  149/88, pulse 83, temperature 98.1 F (36.7 C), temperature source Oral, resp. rate 18, height 5' 10.5" (1.791 m), weight 84.5 kg (186 lb 4.8 oz), SpO2 94 %.  I/O:   Intake/Output Summary (Last 24 hours) at 05/18/16 1555 Last data filed at 05/18/16 1300  Gross per 24 hour  Intake          2996.67 ml  Output             2425 ml  Net           571.67 ml    PHYSICAL EXAMINATION:  GENERAL:  65 y.o.-year-old patient lying in the bed with no acute distress.  EYES: Pupils equal, round, reactive to light and accommodation. No scleral icterus. Extraocular muscles intact.  HEENT: Head atraumatic, normocephalic. Oropharynx and nasopharynx clear.  NECK:  Supple, no jugular venous distention. No thyroid enlargement, no tenderness.  LUNGS: Normal breath sounds bilaterally, no wheezing, rales,rhonchi or crepitation. No use of accessory muscles of respiration.  CARDIOVASCULAR: S1, S2 normal. No murmurs, rubs, or gallops.  ABDOMEN: Soft, non-tender, non-distended. Bowel sounds present. No organomegaly or mass.  EXTREMITIES: No pedal edema, cyanosis, or clubbing.  NEUROLOGIC: Cranial nerves II through XII are intact. Muscle strength 5/5 in all extremities. Sensation intact. Gait not checked.  PSYCHIATRIC: The patient is alert and oriented x 3.  SKIN: No obvious rash, lesion, or ulcer.   DATA REVIEW:   CBC  Recent Labs Lab 05/18/16 0343  WBC 8.8  HGB 14.0  HCT 39.5*  PLT 222    Chemistries   Recent Labs Lab 05/16/16 0521 05/16/16 2215 05/18/16 0343  NA 129* 131* 131*  K 3.4* 3.3* 3.4*  CL 96* 97* 98*  CO2 24 27 24   GLUCOSE 130* 121* 106*  BUN 10 10 8   CREATININE 0.88 0.83 0.73  CALCIUM 7.5* 7.7* 7.6*  MG 1.9  --   --   AST 52* 22  --   ALT 54 43  --   ALKPHOS 126 116  --   BILITOT 0.7 0.6  --     Cardiac Enzymes No results for input(s): TROPONINI in the last 168 hours.  Microbiology Results  Results for orders placed or performed during the hospital encounter of 05/15/16   Blood Culture (routine x 2)     Status: None (Preliminary result)   Collection Time: 05/15/16  4:30 PM  Result Value Ref Range Status   Specimen Description BLOOD LEFT WRIST  Final   Special Requests BOTTLES DRAWN AEROBIC AND ANAEROBIC 1CC  Final   Culture NO GROWTH 3 DAYS  Final   Report Status PENDING  Incomplete  Urine culture     Status: None   Collection Time: 05/15/16  4:30 PM  Result Value Ref Range Status  Specimen Description URINE, RANDOM  Final   Special Requests NONE  Final   Culture NO GROWTH Performed at [facility]   Final   Report Status 05/17/2016 FINAL  Final  Blood Culture (routine x 2)     Status: None (Preliminary result)   Collection Time: 05/15/16  4:31 PM  Result Value Ref Range Status   Specimen Description BLOOD RIGHT ARM  Final   Special Requests BOTTLES DRAWN AEROBIC AND ANAEROBIC 5CC  Final   Culture NO GROWTH 3 DAYS  Final   Report Status PENDING  Incomplete    RADIOLOGY:  No results found.  EKG:   Orders placed or performed during the hospital encounter of 05/15/16  . EKG 12-Lead  . EKG 12-Lead  . ED EKG  . ED EKG      Management plans discussed with the patient, family and they are in agreement.  CODE STATUS:     Code Status Orders        Start     Ordered   05/15/16 1840  Full code  Continuous     05/15/16 1840    Code Status History    Date Active Date Inactive Code Status Order ID Comments User Context   08/24/2014  4:45 AM 08/24/2014  5:16 PM Full Code [id number]  Berle Mull, MD ED      TOTAL TIME TAKING CARE OF THIS PATIENT: 40 minutes.    Theodoro Grist M.D on 05/18/2016 at 3:55 PM  Between 7am to 6pm - Pager - [phone]  After 6pm go to [email or web address] - password [email or web address] [place] [facility]  Office  [phone]  CC: Primary care physician; Madelyn Brunner, MD

## 2016-05-20 LAB — CULTURE, BLOOD (ROUTINE X 2)
Culture: NO GROWTH
Culture: NO GROWTH

## 2016-05-23 ENCOUNTER — Ambulatory Visit
Admission: RE | Admit: 2016-05-23 | Discharge: 2016-05-23 | Disposition: A | Discharge status: Home / Self Care | Payer: Medicare Other | Source: Ambulatory Visit | Attending: Infectious Diseases | Admitting: Infectious Diseases

## 2016-05-23 ENCOUNTER — Other Ambulatory Visit: Payer: Self-pay | Admitting: Infectious Diseases

## 2016-05-23 DIAGNOSIS — R509 Fever, unspecified: Secondary | ICD-10-CM

## 2016-05-23 DIAGNOSIS — R938 Abnormal findings on diagnostic imaging of other specified body structures: Secondary | ICD-10-CM | POA: Diagnosis not present

## 2016-05-23 DIAGNOSIS — J9811 Atelectasis: Secondary | ICD-10-CM | POA: Diagnosis not present

## 2016-05-23 DIAGNOSIS — I700 Atherosclerosis of aorta: Secondary | ICD-10-CM | POA: Diagnosis not present

## 2016-05-23 DIAGNOSIS — J90 Pleural effusion, not elsewhere classified: Secondary | ICD-10-CM | POA: Insufficient documentation

## 2016-05-23 DIAGNOSIS — R9389 Abnormal findings on diagnostic imaging of other specified body structures: Secondary | ICD-10-CM

## 2016-05-23 DIAGNOSIS — I2510 Atherosclerotic heart disease of native coronary artery without angina pectoris: Secondary | ICD-10-CM | POA: Diagnosis not present

## 2016-06-20 DIAGNOSIS — B960 Mycoplasma pneumoniae [M. pneumoniae] as the cause of diseases classified elsewhere: Secondary | ICD-10-CM | POA: Insufficient documentation

## 2016-06-20 DIAGNOSIS — J157 Pneumonia due to Mycoplasma pneumoniae: Secondary | ICD-10-CM | POA: Insufficient documentation

## 2016-11-03 ENCOUNTER — Other Ambulatory Visit: Payer: Self-pay | Admitting: Unknown Physician Specialty

## 2016-11-03 DIAGNOSIS — M5442 Lumbago with sciatica, left side: Principal | ICD-10-CM

## 2016-11-03 DIAGNOSIS — M5441 Lumbago with sciatica, right side: Principal | ICD-10-CM

## 2016-11-03 DIAGNOSIS — G8929 Other chronic pain: Secondary | ICD-10-CM

## 2016-12-05 ENCOUNTER — Ambulatory Visit
Admission: RE | Admit: 2016-12-05 | Discharge: 2016-12-05 | Disposition: A | Discharge status: Home / Self Care | Payer: Medicare Other | Source: Ambulatory Visit | Attending: Unknown Physician Specialty | Admitting: Unknown Physician Specialty

## 2016-12-05 DIAGNOSIS — M5441 Lumbago with sciatica, right side: Secondary | ICD-10-CM | POA: Diagnosis not present

## 2016-12-05 DIAGNOSIS — M5442 Lumbago with sciatica, left side: Secondary | ICD-10-CM | POA: Diagnosis present

## 2016-12-05 DIAGNOSIS — M47816 Spondylosis without myelopathy or radiculopathy, lumbar region: Secondary | ICD-10-CM | POA: Insufficient documentation

## 2016-12-05 DIAGNOSIS — G8929 Other chronic pain: Secondary | ICD-10-CM | POA: Insufficient documentation

## 2016-12-05 DIAGNOSIS — M48061 Spinal stenosis, lumbar region without neurogenic claudication: Secondary | ICD-10-CM | POA: Insufficient documentation

## 2016-12-13 DIAGNOSIS — G8929 Other chronic pain: Secondary | ICD-10-CM | POA: Insufficient documentation

## 2017-05-31 DIAGNOSIS — G20 Parkinson's disease: Secondary | ICD-10-CM | POA: Insufficient documentation

## 2018-07-26 ENCOUNTER — Other Ambulatory Visit: Payer: Self-pay

## 2018-07-26 DIAGNOSIS — Z1211 Encounter for screening for malignant neoplasm of colon: Secondary | ICD-10-CM

## 2018-08-08 ENCOUNTER — Encounter: Payer: Self-pay | Admitting: *Deleted

## 2018-08-08 ENCOUNTER — Other Ambulatory Visit: Payer: Self-pay

## 2018-08-15 NOTE — Discharge Instructions (Signed)
General Anesthesia, Adult, Care After °These instructions provide you with information about caring for yourself after your procedure. Your health care provider may also give you more specific instructions. Your treatment has been planned according to current medical practices, but problems sometimes occur. Call your health care provider if you have any problems or questions after your procedure. °What can I expect after the procedure? °After the procedure, it is common to have: °· Vomiting. °· A sore throat. °· Mental slowness. ° °It is common to feel: °· Nauseous. °· Cold or shivery. °· Sleepy. °· Tired. °· Sore or achy, even in parts of your body where you did not have surgery. ° °Follow these instructions at home: °For at least 24 hours after the procedure: °· Do not: °? Participate in activities where you could fall or become injured. °? Drive. °? Use heavy machinery. °? Drink alcohol. °? Take sleeping pills or medicines that cause drowsiness. °? Make important decisions or sign legal documents. °? Take care of children on your own. °· Rest. °Eating and drinking °· If you vomit, drink water, juice, or soup when you can drink without vomiting. °· Drink enough fluid to keep your urine clear or pale yellow. °· Make sure you have little or no nausea before eating solid foods. °· Follow the diet recommended by your health care provider. °General instructions °· Have a responsible adult stay with you until you are awake and alert. °· Return to your normal activities as told by your health care provider. Ask your health care provider what activities are safe for you. °· Take over-the-counter and prescription medicines only as told by your health care provider. °· If you smoke, do not smoke without supervision. °· Keep all follow-up visits as told by your health care provider. This is important. °Contact a health care provider if: °· You continue to have nausea or vomiting at home, and medicines are not helpful. °· You  cannot drink fluids or start eating again. °· You cannot urinate after 8-12 hours. °· You develop a skin rash. °· You have fever. °· You have increasing redness at the site of your procedure. °Get help right away if: °· You have difficulty breathing. °· You have chest pain. °· You have unexpected bleeding. °· You feel that you are having a life-threatening or urgent problem. °This information is not intended to replace advice given to you by your health care provider. Make sure you discuss any questions you have with your health care provider. °Document Released: 01/02/2001 Document Revised: 02/29/2016 Document Reviewed: 09/10/2015 °Elsevier Interactive Patient Education © 2018 [employer] Inc. ° °

## 2018-08-16 ENCOUNTER — Ambulatory Visit: Payer: Medicare Other | Admitting: Anesthesiology

## 2018-08-16 ENCOUNTER — Ambulatory Visit
Admission: RE | Admit: 2018-08-16 | Discharge: 2018-08-16 | Disposition: A | Discharge status: Home / Self Care | Payer: Medicare Other | Source: Ambulatory Visit | Attending: Gastroenterology | Admitting: Gastroenterology

## 2018-08-16 ENCOUNTER — Encounter
Admission: RE | Disposition: A | Discharge status: Home / Self Care | Payer: Self-pay | Source: Ambulatory Visit | Attending: Gastroenterology

## 2018-08-16 DIAGNOSIS — D125 Benign neoplasm of sigmoid colon: Secondary | ICD-10-CM | POA: Diagnosis not present

## 2018-08-16 DIAGNOSIS — K640 First degree hemorrhoids: Secondary | ICD-10-CM | POA: Diagnosis not present

## 2018-08-16 DIAGNOSIS — Z79899 Other long term (current) drug therapy: Secondary | ICD-10-CM | POA: Insufficient documentation

## 2018-08-16 DIAGNOSIS — K635 Polyp of colon: Secondary | ICD-10-CM

## 2018-08-16 DIAGNOSIS — Z1211 Encounter for screening for malignant neoplasm of colon: Secondary | ICD-10-CM

## 2018-08-16 DIAGNOSIS — K219 Gastro-esophageal reflux disease without esophagitis: Secondary | ICD-10-CM | POA: Insufficient documentation

## 2018-08-16 DIAGNOSIS — G20 Parkinson's disease: Secondary | ICD-10-CM | POA: Diagnosis not present

## 2018-08-16 HISTORY — DX: Gastro-esophageal reflux disease without esophagitis: K21.9

## 2018-08-16 HISTORY — PX: POLYPECTOMY: SHX5525

## 2018-08-16 HISTORY — DX: Dorsopathy, unspecified: M53.9

## 2018-08-16 HISTORY — DX: Parkinson's disease without dyskinesia, without mention of fluctuations: G20.A1

## 2018-08-16 HISTORY — PX: COLONOSCOPY WITH PROPOFOL: SHX5780

## 2018-08-16 HISTORY — DX: Parkinson's disease: G20

## 2018-08-16 SURGERY — COLONOSCOPY WITH PROPOFOL
Anesthesia: General | Site: Rectum

## 2018-08-16 MED ORDER — LIDOCAINE HCL (CARDIAC) PF 100 MG/5ML IV SOSY
PREFILLED_SYRINGE | INTRAVENOUS | Status: DC | PRN
  Administered 2018-08-16: 40 mg via INTRAVENOUS

## 2018-08-16 MED ORDER — PROPOFOL 10 MG/ML IV BOLUS
INTRAVENOUS | Status: DC | PRN
  Administered 2018-08-16: 20 mg via INTRAVENOUS
  Administered 2018-08-16: 50 mg via INTRAVENOUS
  Administered 2018-08-16: 100 mg via INTRAVENOUS
  Administered 2018-08-16 (×2): 30 mg via INTRAVENOUS
  Administered 2018-08-16 (×2): 20 mg via INTRAVENOUS
  Administered 2018-08-16: 50 mg via INTRAVENOUS

## 2018-08-16 MED ORDER — STERILE WATER FOR IRRIGATION IR SOLN
Status: DC | PRN
  Administered 2018-08-16: 09:00:00

## 2018-08-16 MED ORDER — LACTATED RINGERS IV SOLN
INTRAVENOUS | Status: DC
  Administered 2018-08-16: 09:00:00 via INTRAVENOUS

## 2018-08-16 SURGICAL SUPPLY — 6 items
CANISTER SUCT 1200ML W/VALVE (MISCELLANEOUS) ×3 IMPLANT
FORCEPS BIOP RAD 4 LRG CAP 4 (CUTTING FORCEPS) ×3 IMPLANT
GOWN CVR UNV OPN BCK APRN NK (MISCELLANEOUS) ×2 IMPLANT
GOWN ISOL THUMB LOOP REG UNIV (MISCELLANEOUS) ×4
KIT ENDO PROCEDURE OLY (KITS) ×3 IMPLANT
WATER STERILE IRR 250ML POUR (IV SOLUTION) ×3 IMPLANT

## 2018-08-16 NOTE — Op Note (Signed)
[facility] Patient Name: Dale [name] Procedure Date: 08/16/2018 8:48 AM MRN: [id number] Account #: [id number] Date of Birth: 08-23-1951 Admit Type: Outpatient Age: 67 Room: [facility] OR ROOM 01 Gender: Male Note Status: Finalized Procedure:            Colonoscopy Indications:          Screening for colorectal malignant neoplasm Providers:            [name] MD, MD Referring MD:         Hewitt Blade. Sarina Ser, MD (Referring MD) Medicines:            Propofol per Anesthesia Complications:        No immediate complications. Procedure:            Pre-Anesthesia Assessment:                       - Prior to the procedure, a History and Physical was                        performed, and patient medications and allergies were                        reviewed. The patient's tolerance of previous                        anesthesia was also reviewed. The risks and benefits of                        the procedure and the sedation options and risks were                        discussed with the patient. All questions were                        answered, and informed consent was obtained. Prior                        Anticoagulants: The patient has taken no previous                        anticoagulant or antiplatelet agents. ASA Grade                        Assessment: II - A patient with mild systemic disease.                        After reviewing the risks and benefits, the patient was                        deemed in satisfactory condition to undergo the                        procedure.                       After obtaining informed consent, the colonoscope was                        passed under direct vision. Throughout the procedure,  the patient's blood pressure, pulse, and oxygen                        saturations were monitored continuously. The was                        introduced through the anus and advanced to the the                     cecum, identified by appendiceal orifice and ileocecal                        valve. The colonoscopy was performed without                        difficulty. The patient tolerated the procedure well.                        The quality of the bowel preparation was excellent. Findings:      The perianal and digital rectal examinations were normal.      A 3 mm polyp was found in the sigmoid colon. The polyp was sessile. The       polyp was removed with a cold biopsy forceps. Resection and retrieval       were complete.      Non-bleeding internal hemorrhoids were found during retroflexion. The       hemorrhoids were Grade I (internal hemorrhoids that do not prolapse). Impression:           - One 3 mm polyp in the sigmoid colon, removed with a                        cold biopsy forceps. Resected and retrieved.                       - Non-bleeding internal hemorrhoids. Recommendation:       - Discharge patient to home.                       - Resume previous diet.                       - Continue present medications.                       - Await pathology results.                       - Repeat colonoscopy in 5 years for surveillance. Procedure Code(s):    --- Professional ---                       [phone], Colonoscopy, flexible; with biopsy, single or                        multiple Diagnosis Code(s):    --- Professional ---                       Z12.11, Encounter for screening for malignant neoplasm                        of colon  D12.5, Benign neoplasm of sigmoid colon CPT copyright 2018 American Medical Association. All rights reserved. The codes documented in this report are preliminary and upon coder review may  be revised to meet current compliance requirements. [name] MD, MD 08/16/2018 9:16:25 AM This report has been signed electronically. Number of Addenda: 0 Note Initiated On: 08/16/2018 8:48 AM Scope Withdrawal Time: 0 hours 6 minutes  24 seconds  Total Procedure Duration: 0 hours 9 minutes 44 seconds       [facility]

## 2018-08-16 NOTE — Transfer of Care (Signed)
Immediate Anesthesia Transfer of Care Note  Patient: Dale [name]  Procedure(s) Performed: COLONOSCOPY WITH BIOPSY (N/A Rectum) POLYPECTOMY (N/A Rectum)  Patient Location: PACU  Anesthesia Type: General  Level of Consciousness: awake, alert  and patient cooperative  Airway and Oxygen Therapy: Patient Spontanous Breathing and Patient connected to supplemental oxygen  Post-op Assessment: Post-op Vital signs reviewed, Patient's Cardiovascular Status Stable, Respiratory Function Stable, Patent Airway and No signs of Nausea or vomiting  Post-op Vital Signs: Reviewed and stable  Complications: No apparent anesthesia complications

## 2018-08-16 NOTE — Anesthesia Procedure Notes (Signed)
Date/Time: 08/16/2018 8:57 AM Performed by: Janna Arch, CRNA Pre-anesthesia Checklist: Patient identified, Emergency Drugs available, Suction available, Timeout performed and Patient being monitored Patient Re-evaluated:Patient Re-evaluated prior to induction Oxygen Delivery Method: Nasal cannula Placement Confirmation: positive ETCO2

## 2018-08-16 NOTE — H&P (Signed)
Dale Lame, MD [facility] [address] [place], [place] [address] Phone: [phone] Fax : [phone]  Primary Care Physician:  Madelyn Brunner, MD Primary Gastroenterologist:  Dr. Allen Norris  Pre-Procedure History & Physical: HPI:  Dale [name] is a 67 y.o. male is here for a screening colonoscopy.   Past Medical History:  Diagnosis Date  . GERD (gastroesophageal reflux disease)   . Multilevel degenerative disc disease    L2,3,4 and Cervical  . 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 SHOULDER ARTHROSCOPY    . TONSILLECTOMY      Prior to Admission medications   Medication Sig Start Date End Date Taking? Authorizing Provider  carbidopa-levodopa (PARCOPA) 25-100 MG disintegrating tablet Take 1 tablet by mouth 3 (three) times daily.   Yes [provider]  diphenhydrAMINE (SOMINEX) 25 MG tablet Take 25 mg by mouth at bedtime as needed for sleep.   Yes [provider]  omeprazole (PRILOSEC) 40 MG capsule Take 40 mg by mouth every morning.    Yes [provider]  sildenafil (REVATIO) 20 MG tablet Take 20 mg by mouth every morning.   Yes [provider]  Testosterone 20 % CREA Apply 1 mL topically daily.   Yes [provider]    Allergies as of 07/26/2018  . (No Known Allergies)    History reviewed. No pertinent family history.  Social History   Socioeconomic History  . Marital status: Married    Spouse name: Not on file  . Number of children: Not on file  . Years of education: Not on file  . Highest education level: Not on file  Occupational History  . Not on file  Social Needs  . Financial resource strain: Not on file  . Food insecurity:    Worry: Not on file    Inability: Not on file  . Transportation needs:    Medical: Not on file    Non-medical: Not on file  Tobacco Use  . Smoking status: Never Smoker  . Smokeless tobacco: Never Used  Substance and  Sexual Activity  . Alcohol use: Yes    Alcohol/week: 6.0 standard drinks    Types: 6 Glasses of wine per week    Comment:    . Drug use: No  . Sexual activity: Not on file  Lifestyle  . Physical activity:    Days per week: Not on file    Minutes per session: Not on file  . Stress: Not on file  Relationships  . Social connections:    Talks on phone: Not on file    Gets together: Not on file    Attends religious service: Not on file    Active member of club or organization: Not on file    Attends meetings of clubs or organizations: Not on file    Relationship status: Not on file  . Intimate partner violence:    Fear of current or ex partner: Not on file    Emotionally abused: Not on file    Physically abused: Not on file    Forced sexual activity: Not on file  Other Topics Concern  . Not on file  Social History Narrative  . Not on file    Review of Systems: See HPI, otherwise negative ROS  Physical Exam: BP 120/81   Pulse (!) 58   Temp 98.1 F (36.7 C) (Temporal)   Ht 5' 10.5" (1.791 m)  Wt 78 kg   SpO2 100%   BMI 24.33 kg/m  General:   Alert,  pleasant and cooperative in NAD Head:  Normocephalic and atraumatic. Neck:  Supple; no masses or thyromegaly. Lungs:  Clear throughout to auscultation.    Heart:  Regular rate and rhythm. Abdomen:  Soft, nontender and nondistended. Normal bowel sounds, without guarding, and without rebound.   Neurologic:  Alert and  oriented x4;  grossly normal neurologically.  Impression/Plan: [name] is now here to undergo a screening colonoscopy.  Risks, benefits, and alternatives regarding colonoscopy have been reviewed with the patient.  Questions have been answered.  All parties agreeable.

## 2018-08-16 NOTE — Anesthesia Postprocedure Evaluation (Signed)
Anesthesia Post Note  Patient: Dale [name]  Procedure(s) Performed: COLONOSCOPY WITH BIOPSY (N/A Rectum) POLYPECTOMY (N/A Rectum)  Patient location during evaluation: PACU Anesthesia Type: General Level of consciousness: awake and alert and oriented Pain management: satisfactory to patient Vital Signs Assessment: post-procedure vital signs reviewed and stable Respiratory status: spontaneous breathing, nonlabored ventilation and respiratory function stable Cardiovascular status: blood pressure returned to baseline and stable Postop Assessment: Adequate PO intake and No signs of nausea or vomiting Anesthetic complications: no    Raliegh Ip

## 2018-08-16 NOTE — Anesthesia Preprocedure Evaluation (Signed)
Anesthesia Evaluation  Patient identified by MRN, date of birth, ID band Patient awake    Reviewed: Allergy & Precautions, H&P , NPO status , Patient's Chart, lab work & pertinent test results  Airway Mallampati: II  TM Distance: >3 FB Neck ROM: full    Dental no notable dental hx.    Pulmonary    Pulmonary exam normal breath sounds clear to auscultation       Cardiovascular Normal cardiovascular exam Rhythm:regular Rate:Normal     Neuro/Psych Parkinson    GI/Hepatic GERD  ,  Endo/Other    Renal/GU      Musculoskeletal   Abdominal   Peds  Hematology   Anesthesia Other Findings   Reproductive/Obstetrics                             Anesthesia Physical Anesthesia Plan  ASA: II  Anesthesia Plan: General   Post-op Pain Management:    Induction: Intravenous  PONV Risk Score and Plan: 2 and Propofol infusion and Treatment may vary due to age or medical condition  Airway Management Planned: Natural Airway  Additional Equipment:   Intra-op Plan:   Post-operative Plan:   Informed Consent: I have reviewed the patients History and Physical, chart, labs and discussed the procedure including the risks, benefits and alternatives for the proposed anesthesia with the patient or authorized representative who has indicated his/her understanding and acceptance.     Plan Discussed with: CRNA  Anesthesia Plan Comments:         Anesthesia Quick Evaluation

## 2018-08-17 ENCOUNTER — Encounter: Payer: Self-pay | Admitting: Gastroenterology

## 2018-08-20 ENCOUNTER — Encounter: Payer: Self-pay | Admitting: Gastroenterology

## 2018-08-21 ENCOUNTER — Encounter: Payer: Self-pay | Admitting: Gastroenterology

## 2019-03-15 ENCOUNTER — Other Ambulatory Visit: Payer: Self-pay | Admitting: Physician Assistant

## 2019-03-15 DIAGNOSIS — M25562 Pain in left knee: Secondary | ICD-10-CM

## 2019-04-01 ENCOUNTER — Ambulatory Visit: Payer: Medicare Other

## 2019-04-01 ENCOUNTER — Other Ambulatory Visit: Payer: Self-pay

## 2019-04-01 ENCOUNTER — Ambulatory Visit
Admission: RE | Admit: 2019-04-01 | Discharge: 2019-04-01 | Disposition: A | Discharge status: Home / Self Care | Payer: Medicare Other | Source: Ambulatory Visit | Attending: Physician Assistant | Admitting: Physician Assistant

## 2019-04-01 DIAGNOSIS — M25562 Pain in left knee: Secondary | ICD-10-CM | POA: Diagnosis present

## 2019-04-21 DIAGNOSIS — M75100 Unspecified rotator cuff tear or rupture of unspecified shoulder, not specified as traumatic: Secondary | ICD-10-CM | POA: Insufficient documentation

## 2019-06-06 ENCOUNTER — Other Ambulatory Visit: Payer: Self-pay

## 2019-06-06 ENCOUNTER — Encounter
Admission: RE | Admit: 2019-06-06 | Discharge: 2019-06-06 | Disposition: A | Discharge status: Home / Self Care | Payer: Medicare Other | Source: Ambulatory Visit | Attending: Orthopedic Surgery | Admitting: Orthopedic Surgery

## 2019-06-06 DIAGNOSIS — Z01810 Encounter for preprocedural cardiovascular examination: Secondary | ICD-10-CM

## 2019-06-06 DIAGNOSIS — R001 Bradycardia, unspecified: Secondary | ICD-10-CM | POA: Diagnosis not present

## 2019-06-06 DIAGNOSIS — Z01818 Encounter for other preprocedural examination: Secondary | ICD-10-CM | POA: Insufficient documentation

## 2019-06-06 DIAGNOSIS — Z20828 Contact with and (suspected) exposure to other viral communicable diseases: Secondary | ICD-10-CM | POA: Diagnosis not present

## 2019-06-06 HISTORY — DX: Pneumonia due to Mycoplasma pneumoniae: J15.7

## 2019-06-06 LAB — CBC
HCT: 42 % (ref 39.0–52.0)
Hemoglobin: 14.4 g/dL (ref 13.0–17.0)
MCH: 30.9 pg (ref 26.0–34.0)
MCHC: 34.3 g/dL (ref 30.0–36.0)
MCV: 90.1 fL (ref 80.0–100.0)
Platelets: 284 10*3/uL (ref 150–400)
RBC: 4.66 MIL/uL (ref 4.22–5.81)
RDW: 13.1 % (ref 11.5–15.5)
WBC: 11.5 10*3/uL — ABNORMAL HIGH (ref 4.0–10.5)
nRBC: 0 % (ref 0.0–0.2)

## 2019-06-06 NOTE — Patient Instructions (Signed)
Your procedure is scheduled on: Wednesday 06/12/19 Report to [facility]. To find out your arrival time please call [phone] between 1PM - 3PM on Tuesday 06/11/19.  Remember: Instructions that are not followed completely may result in serious medical risk, up to and including death, or upon the discretion of your surgeon and anesthesiologist your surgery may need to be rescheduled.     _X__ 1. Do not eat food after midnight the night before your procedure.                 No gum chewing or hard candies. You may drink clear liquids up to 2 hours                 before you are scheduled to arrive for your surgery- DO not drink clear                 liquids within 2 hours of the start of your surgery.                 Clear Liquids include:  water, apple juice without pulp, clear carbohydrate                 drink such as Clearfast or Gatorade, Black Coffee or Tea (Do not add                 anything to coffee or tea). Diabetics water only  __X__2.  On the morning of surgery brush your teeth with toothpaste and water, you                 may rinse your mouth with mouthwash if you wish.  Do not swallow any              toothpaste of mouthwash.     _X__ 3.  No Alcohol for 24 hours before or after surgery.   _X__ 4.  Do Not Smoke or use e-cigarettes For 24 Hours Prior to Your Surgery.                 Do not use any chewable tobacco products for at least 6 hours prior to                 surgery.  ____  5.  Bring all medications with you on the day of surgery if instructed.   __X__  6.  Notify your doctor if there is any change in your medical condition      (cold, fever, infections).     Do not wear jewelry, make-up, hairpins, clips or nail polish. Do not wear lotions, powders, or perfumes.  Do not shave 48 hours prior to surgery. Men may shave face and neck. Do not bring valuables to the hospital.    [facility] is not responsible for  any belongings or valuables.  Contacts, dentures/partials or body piercings may not be worn into surgery. Bring a case for your contacts, glasses or hearing aids, a denture cup will be supplied. Leave your suitcase in the car. After surgery it may be brought to your room. For patients admitted to the hospital, discharge time is determined by your treatment team.   Patients discharged the day of surgery will not be allowed to drive home.   Please read over the following fact sheets that you were given:   MRSA Information  __X__ Take these medicines the morning of surgery with A SIP OF WATER:  1. carbidopa-levodopa (PARCOPA  2. omeprazole (PRILOSEC  3.   4.  5.  6.  ____ Fleet Enema (as directed)   __X__ Use CHG Soap/SAGE wipes as directed  ____ Use inhalers on the day of surgery  ____ Stop metformin/Janumet/Farxiga 2 days prior to surgery    ____ Take 1/2 of usual insulin dose the night before surgery. No insulin the morning          of surgery.   ____ Stop Blood Thinners Coumadin/Plavix/Xarelto/Pleta/Pradaxa/Eliquis/Effient/Aspirin  on   Or contact your Surgeon, Cardiologist or Medical Doctor regarding  ability to stop your blood thinners  __X__ Stop Anti-inflammatories 7 days before surgery such as Advil, Ibuprofen, Motrin,  BC or Goodies Powder, Naprosyn, Naproxen, Aleve, Aspirin    __X__ Stop all herbal supplements, fish oil or vitamin E until after surgery.    ____ Bring C-Pap to the hospital.

## 2019-06-07 ENCOUNTER — Other Ambulatory Visit: Payer: Self-pay

## 2019-06-07 ENCOUNTER — Other Ambulatory Visit
Admission: RE | Admit: 2019-06-07 | Discharge: 2019-06-07 | Disposition: A | Discharge status: Home / Self Care | Payer: Medicare Other | Source: Ambulatory Visit | Attending: Orthopedic Surgery | Admitting: Orthopedic Surgery

## 2019-06-07 DIAGNOSIS — Z01818 Encounter for other preprocedural examination: Secondary | ICD-10-CM | POA: Diagnosis not present

## 2019-06-07 LAB — SARS CORONAVIRUS 2 (TAT 6-24 HRS): SARS Coronavirus 2: NEGATIVE

## 2019-06-11 ENCOUNTER — Encounter: Payer: Self-pay | Admitting: Orthopedic Surgery

## 2019-06-11 DIAGNOSIS — D045 Carcinoma in situ of skin of trunk: Secondary | ICD-10-CM | POA: Insufficient documentation

## 2019-06-12 ENCOUNTER — Ambulatory Visit
Admission: RE | Admit: 2019-06-12 | Discharge: 2019-06-12 | Disposition: A | Discharge status: Home / Self Care | Payer: Medicare Other | Attending: Orthopedic Surgery | Admitting: Orthopedic Surgery

## 2019-06-12 ENCOUNTER — Ambulatory Visit: Payer: Medicare Other | Admitting: Anesthesiology

## 2019-06-12 ENCOUNTER — Other Ambulatory Visit: Payer: Self-pay

## 2019-06-12 ENCOUNTER — Encounter
Admission: RE | Disposition: A | Discharge status: Home / Self Care | Payer: Self-pay | Source: Home / Self Care | Attending: Orthopedic Surgery

## 2019-06-12 DIAGNOSIS — K219 Gastro-esophageal reflux disease without esophagitis: Secondary | ICD-10-CM | POA: Diagnosis not present

## 2019-06-12 DIAGNOSIS — S83242A Other tear of medial meniscus, current injury, left knee, initial encounter: Secondary | ICD-10-CM | POA: Insufficient documentation

## 2019-06-12 DIAGNOSIS — S83272A Complex tear of lateral meniscus, current injury, left knee, initial encounter: Secondary | ICD-10-CM | POA: Diagnosis not present

## 2019-06-12 DIAGNOSIS — X58XXXA Exposure to other specified factors, initial encounter: Secondary | ICD-10-CM | POA: Insufficient documentation

## 2019-06-12 DIAGNOSIS — Z9889 Other specified postprocedural states: Secondary | ICD-10-CM

## 2019-06-12 DIAGNOSIS — Z79899 Other long term (current) drug therapy: Secondary | ICD-10-CM | POA: Diagnosis not present

## 2019-06-12 DIAGNOSIS — Z85820 Personal history of malignant melanoma of skin: Secondary | ICD-10-CM | POA: Diagnosis not present

## 2019-06-12 DIAGNOSIS — G20 Parkinson's disease: Secondary | ICD-10-CM | POA: Insufficient documentation

## 2019-06-12 DIAGNOSIS — M25562 Pain in left knee: Secondary | ICD-10-CM | POA: Diagnosis present

## 2019-06-12 HISTORY — PX: KNEE ARTHROSCOPY: SHX127

## 2019-06-12 SURGERY — ARTHROSCOPY, KNEE
Anesthesia: General | Site: Knee | Laterality: Left

## 2019-06-12 MED ORDER — ONDANSETRON HCL 4 MG/2ML IJ SOLN
INTRAMUSCULAR | Status: AC
  Filled 2019-06-12: qty 2

## 2019-06-12 MED ORDER — LACTATED RINGERS IV SOLN
INTRAVENOUS | Status: DC
  Administered 2019-06-12 (×2): via INTRAVENOUS

## 2019-06-12 MED ORDER — PROPOFOL 10 MG/ML IV BOLUS
INTRAVENOUS | Status: DC | PRN
  Administered 2019-06-12: 160 mg via INTRAVENOUS

## 2019-06-12 MED ORDER — HYDROCODONE-ACETAMINOPHEN 5-325 MG PO TABS: 1.0000 | ORAL_TABLET | ORAL | 0 refills | Status: DC | PRN

## 2019-06-12 MED ORDER — ONDANSETRON HCL 4 MG/2ML IJ SOLN
INTRAMUSCULAR | Status: DC | PRN
  Administered 2019-06-12: 4 mg via INTRAVENOUS

## 2019-06-12 MED ORDER — EPHEDRINE SULFATE 50 MG/ML IJ SOLN
INTRAMUSCULAR | Status: AC
  Filled 2019-06-12: qty 1

## 2019-06-12 MED ORDER — FENTANYL CITRATE (PF) 100 MCG/2ML IJ SOLN
INTRAMUSCULAR | Status: AC
  Administered 2019-06-12: 21:00:00 25 ug via INTRAVENOUS
  Filled 2019-06-12: qty 2

## 2019-06-12 MED ORDER — ACETAMINOPHEN 10 MG/ML IV SOLN
INTRAVENOUS | Status: DC | PRN
  Administered 2019-06-12: 1000 mg via INTRAVENOUS

## 2019-06-12 MED ORDER — FENTANYL CITRATE (PF) 100 MCG/2ML IJ SOLN
INTRAMUSCULAR | Status: AC
  Administered 2019-06-12: 20:00:00 25 ug via INTRAVENOUS
  Filled 2019-06-12: qty 2

## 2019-06-12 MED ORDER — ACETAMINOPHEN 10 MG/ML IV SOLN
INTRAVENOUS | Status: AC
  Filled 2019-06-12: qty 100

## 2019-06-12 MED ORDER — DEXAMETHASONE SODIUM PHOSPHATE 10 MG/ML IJ SOLN
INTRAMUSCULAR | Status: AC
  Filled 2019-06-12: qty 1

## 2019-06-12 MED ORDER — EPHEDRINE SULFATE 50 MG/ML IJ SOLN
INTRAMUSCULAR | Status: DC | PRN
  Administered 2019-06-12: 10 mg via INTRAVENOUS
  Administered 2019-06-12 (×2): 20 mg via INTRAVENOUS

## 2019-06-12 MED ORDER — BUPIVACAINE-EPINEPHRINE 0.25% -1:200000 IJ SOLN
INTRAMUSCULAR | Status: DC | PRN
  Administered 2019-06-12: 5 mL

## 2019-06-12 MED ORDER — FENTANYL CITRATE (PF) 100 MCG/2ML IJ SOLN
INTRAMUSCULAR | Status: DC | PRN
  Administered 2019-06-12: 25 ug via INTRAVENOUS

## 2019-06-12 MED ORDER — CELECOXIB 200 MG PO CAPS
ORAL_CAPSULE | ORAL | Status: AC
  Administered 2019-06-12: 15:00:00 400 mg via ORAL
  Filled 2019-06-12: qty 2

## 2019-06-12 MED ORDER — ONDANSETRON HCL 4 MG/2ML IJ SOLN: 4.0000 mg | Freq: Once | INTRAMUSCULAR | Status: DC | PRN

## 2019-06-12 MED ORDER — BUPIVACAINE-EPINEPHRINE (PF) 0.25% -1:200000 IJ SOLN
INTRAMUSCULAR | Status: AC
  Filled 2019-06-12: qty 30

## 2019-06-12 MED ORDER — FENTANYL CITRATE (PF) 100 MCG/2ML IJ SOLN
INTRAMUSCULAR | Status: AC
  Filled 2019-06-12: qty 2

## 2019-06-12 MED ORDER — CELECOXIB 200 MG PO CAPS
400.0000 mg | ORAL_CAPSULE | Freq: Once | ORAL | Status: AC
  Administered 2019-06-12: 15:00:00 400 mg via ORAL

## 2019-06-12 MED ORDER — MORPHINE SULFATE (PF) 4 MG/ML IV SOLN
INTRAVENOUS | Status: AC
  Filled 2019-06-12: qty 1

## 2019-06-12 MED ORDER — LIDOCAINE HCL (CARDIAC) PF 100 MG/5ML IV SOSY
PREFILLED_SYRINGE | INTRAVENOUS | Status: DC | PRN
  Administered 2019-06-12: 80 mg via INTRAVENOUS

## 2019-06-12 MED ORDER — PROPOFOL 10 MG/ML IV BOLUS
INTRAVENOUS | Status: AC
  Filled 2019-06-12: qty 20

## 2019-06-12 MED ORDER — DEXAMETHASONE SODIUM PHOSPHATE 10 MG/ML IJ SOLN
INTRAMUSCULAR | Status: DC | PRN
  Administered 2019-06-12: 10 mg via INTRAVENOUS

## 2019-06-12 MED ORDER — CHLORHEXIDINE GLUCONATE 4 % EX LIQD: 60.0000 mL | Freq: Once | CUTANEOUS | Status: DC

## 2019-06-12 MED ORDER — FENTANYL CITRATE (PF) 100 MCG/2ML IJ SOLN
25.0000 ug | INTRAMUSCULAR | Status: AC | PRN
  Administered 2019-06-12 (×6): 25 ug via INTRAVENOUS

## 2019-06-12 MED ORDER — LIDOCAINE HCL (PF) 2 % IJ SOLN
INTRAMUSCULAR | Status: AC
  Filled 2019-06-12: qty 10

## 2019-06-12 MED ORDER — MIDAZOLAM HCL 2 MG/2ML IJ SOLN
INTRAMUSCULAR | Status: AC
  Filled 2019-06-12: qty 2

## 2019-06-12 SURGICAL SUPPLY — 26 items
BLADE SHAVER 4.5 DBL SERAT CV (CUTTER) IMPLANT
COVER WAND RF STERILE (DRAPES) ×3 IMPLANT
CUFF TOURN SGL QUICK 24 (TOURNIQUET CUFF) ×2
CUFF TOURN SGL QUICK 30 (TOURNIQUET CUFF)
CUFF TRNQT CYL 24X4X16.5-23 (TOURNIQUET CUFF) ×1 IMPLANT
CUFF TRNQT CYL 30X4X21-28X (TOURNIQUET CUFF) IMPLANT
DRSG DERMACEA 8X12 NADH (GAUZE/BANDAGES/DRESSINGS) ×3 IMPLANT
DURAPREP 26ML APPLICATOR (WOUND CARE) ×6 IMPLANT
GAUZE SPONGE 4X4 12PLY STRL (GAUZE/BANDAGES/DRESSINGS) ×3 IMPLANT
GLOVE BIOGEL M STRL SZ7.5 (GLOVE) ×3 IMPLANT
GLOVE INDICATOR 8.0 STRL GRN (GLOVE) ×3 IMPLANT
GOWN STRL REUS W/ TWL LRG LVL3 (GOWN DISPOSABLE) ×2 IMPLANT
GOWN STRL REUS W/TWL LRG LVL3 (GOWN DISPOSABLE) ×4
IV LACTATED RINGER IRRG 3000ML (IV SOLUTION) ×12
IV LR IRRIG 3000ML ARTHROMATIC (IV SOLUTION) ×6 IMPLANT
KIT TURNOVER KIT A (KITS) ×3 IMPLANT
MANIFOLD NEPTUNE II (INSTRUMENTS) ×3 IMPLANT
PACK ARTHROSCOPY KNEE (MISCELLANEOUS) ×3 IMPLANT
SET TUBE SUCT SHAVER OUTFL 24K (TUBING) ×3 IMPLANT
SET TUBE TIP INTRA-ARTICULAR (MISCELLANEOUS) ×3 IMPLANT
SOL PREP PROV IODINE SCRUB 4OZ (MISCELLANEOUS) ×3 IMPLANT
SUT ETHILON 3-0 FS-10 30 BLK (SUTURE) ×3
SUTURE EHLN 3-0 FS-10 30 BLK (SUTURE) ×1 IMPLANT
TUBING ARTHRO INFLOW-ONLY STRL (TUBING) ×3 IMPLANT
WAND HAND CNTRL MULTIVAC 50 (MISCELLANEOUS) ×3 IMPLANT
WRAP KNEE W/COLD PACKS 25.5X14 (SOFTGOODS) ×3 IMPLANT

## 2019-06-12 NOTE — Progress Notes (Signed)
Patient educated on incentive spirometry with return teach back and demonstration.

## 2019-06-12 NOTE — Anesthesia Post-op Follow-up Note (Signed)
Anesthesia QCDR form completed.        

## 2019-06-12 NOTE — Anesthesia Postprocedure Evaluation (Signed)
Anesthesia Post Note  Patient: Dale [name]  Procedure(s) Performed: ARTHROSCOPY KNEE partial medial lateral menisectomy (Left Knee)  Patient location during evaluation: PACU Anesthesia Type: General Level of consciousness: awake and alert Pain management: pain level controlled Vital Signs Assessment: post-procedure vital signs reviewed and stable Respiratory status: spontaneous breathing and respiratory function stable Cardiovascular status: stable Anesthetic complications: no     Last Vitals:  Vitals:   06/12/19 1914 06/12/19 1929  BP: 119/70 (!) 152/78  Pulse: 65 69  Resp: 12 18  Temp:    SpO2: 100% 100%    Last Pain:  Vitals:   06/12/19 1935  TempSrc:   PainSc: 5                  [name]

## 2019-06-12 NOTE — Transfer of Care (Signed)
Immediate Anesthesia Transfer of Care Note  Patient: Dale [name]  Procedure(s) Performed: ARTHROSCOPY KNEE partial medial lateral menisectomy (Left Knee)  Patient Location: PACU  Anesthesia Type:General  Level of Consciousness: sedated  Airway & Oxygen Therapy: Patient Spontanous Breathing and Patient connected to face mask oxygen  Post-op Assessment: Report given to RN and Post -op Vital signs reviewed and stable  Post vital signs: Reviewed and stable  Last Vitals:  Vitals Value Taken Time  BP    Temp    Pulse    Resp    SpO2      Last Pain:  Vitals:   06/12/19 1451  TempSrc: Temporal  PainSc: 0-No pain         Complications: No apparent anesthesia complications

## 2019-06-12 NOTE — Anesthesia Preprocedure Evaluation (Signed)
Anesthesia Evaluation  Patient identified by MRN, date of birth, ID band Patient awake    Reviewed: Allergy & Precautions, NPO status , Patient's Chart, lab work & pertinent test results  History of Anesthesia Complications Negative for: history of anesthetic complications  Airway Mallampati: I       Dental   Pulmonary neg sleep apnea, pneumonia (hx of mycoplasma pnuemonia with recurrent URIs since), neg COPD, Not current smoker,           Cardiovascular (-) hypertension(-) Past MI and (-) CHF (-) dysrhythmias (-) Valvular Problems/Murmurs     Neuro/Psych neg Seizures    GI/Hepatic Neg liver ROS, GERD  Medicated and Controlled,  Endo/Other  neg diabetes  Renal/GU negative Renal ROS     Musculoskeletal   Abdominal   Peds  Hematology   Anesthesia Other Findings   Reproductive/Obstetrics                             Anesthesia Physical Anesthesia Plan  ASA: II  Anesthesia Plan: General   Post-op Pain Management:    Induction: Intravenous  PONV Risk Score and Plan: 2 and Ondansetron and Dexamethasone  Airway Management Planned: LMA  Additional Equipment:   Intra-op Plan:   Post-operative Plan:   Informed Consent: I have reviewed the patients History and Physical, chart, labs and discussed the procedure including the risks, benefits and alternatives for the proposed anesthesia with the patient or authorized representative who has indicated his/her understanding and acceptance.       Plan Discussed with:   Anesthesia Plan Comments:         Anesthesia Quick Evaluation

## 2019-06-12 NOTE — Op Note (Signed)
OPERATIVE NOTE  DATE OF SURGERY:  06/12/2019  PATIENT NAME:  Dale [name]   DOB: 1951-04-08  MRN: [id number]   PRE-OPERATIVE DIAGNOSIS:  Internal derangement of the left knee   POST-OPERATIVE DIAGNOSIS:   Tear of the posterior horn of the medial meniscus, left knee Tear of the posterior horn of the lateral meniscus, left knee  PROCEDURE:  Left knee arthroscopy, partial medial and lateral meniscectomies  SURGEON:  Marciano Sequin., M.D.   ASSISTANT: none  ANESTHESIA: general  ESTIMATED BLOOD LOSS: Minimal  FLUIDS REPLACED: 1700 mL of crystalloid  TOURNIQUET TIME: Not used  INDICATIONS FOR SURGERY: Dale [name] is a 68 y.o. year old male who has been seen for complaints of left knee pain. MRI demonstrated findings consistent with meniscal pathology. After discussion of the risks and benefits of surgical intervention, the patient expressed understanding of the risks benefits and agree with plans for left knee arthroscopy.   PROCEDURE IN DETAIL: The patient was brought into the operating room and, after adequate general anesthesia was achieved, a tourniquet was applied to the left thigh and the leg was placed in the leg holder. All bony prominences were well padded. The patient's left knee was cleaned and prepped with alcohol and Duraprep and draped in the usual sterile fashion. A "timeout" was performed as per usual protocol. The anticipated portal sites were injected with 0.25% Marcaine with epinephrine. An anterolateral incision was made and a cannula was inserted. A small effusion was evacuated and the knee was distended with fluid using the pump. The scope was advanced down the medial gutter into the medial compartment. Under visualization with the scope, an anteromedial portal was created and a hooked probe was inserted. The medial meniscus was visualized and probed.  There was a flap type tear of the posterior horn of the medial meniscus.  The tear was debrided using  meniscal punches and a 4.5 mm incisor shaver.  Final contouring was performed using the 50 degree ArthroCare wand.  The remaining rim of meniscus was visualized and probed and felt to be stable.  The articular cartilage was visualized and noted to be in excellent condition.  The scope was then advanced into the intercondylar notch. The anterior cruciate ligament was visualized and probed and felt to be intact. The scope was removed from the lateral portal and reinserted via the anteromedial portal to better visualize the lateral compartment. The lateral meniscus was visualized and probed.  There was a localized complex tear involving the posterior horn of the lateral meniscus.  The tear was debrided using meniscal punches and a 4.5 mm shaver.  Final contouring was performed using the ArthroCare wand.  Remaining portion of the meniscus was visualized and probed and felt be stable.  The articular cartilage of the lateral compartment was visualized and noted to be in excellent condition.  Finally, the scope was advanced so as to visualize the patellofemoral articulation. Good patellar tracking was appreciated.  The articular surface was in good condition.  The knee was irrigated with copius amounts of fluid and suctioned dry. The anterolateral portal was re-approximated with #3-0 nylon. A combination of 0.25% Marcaine with epinephrine and 4 mg of Morphine were injected via the scope. The scope was removed and the anteromedial portal was re-approximated with #3-0 nylon. A sterile dressing was applied followed by application of an ice wrap.  The patient tolerated the procedure well and was transported to the PACU in stable condition.  James P. Holley Bouche., M.D.

## 2019-06-12 NOTE — Discharge Instructions (Signed)
°  Instructions after Knee Arthroscopy  ° °- James P. Hooten, Jr., M.D.    ° Dept. of Orthopaedics & Sports Medicine ° [facility] ° [address] ° [place], [place]  [address] ° ° Phone: [phone]   Fax: [phone] ° ° °DIET: °• Drink plenty of non-alcoholic fluids & begin a light diet. °• Resume your normal diet the day after surgery. ° °ACTIVITY:  °• You may use crutches or a walker with weight-bearing as tolerated, unless instructed otherwise. °• You may wean yourself off of the walker or crutches as tolerated.  °• Begin doing gentle exercises. Exercising will reduce the pain and swelling, increase motion, and prevent muscle weakness.   °• Avoid strenuous activities or athletics for a minimum of 4-6 weeks after arthroscopic surgery. °• Do not drive or operate any equipment until instructed. ° °WOUND CARE:  °• Place one to two pillows under the knee the first day or two when sitting or lying.  °• Continue to use the ice packs periodically to reduce pain and swelling. °• The small incisions in your knee are closed with nylon stitches. The stitches will be removed in the office. °• The bulky dressing may be removed on the second day after surgery. DO NOT TOUCH THE STITCHES. Put a Band-Aid over each stitch. Do NOT use any ointments or creams on the incisions.  °• You may bathe or shower after the stitches are removed at the first office visit following surgery. ° °MEDICATIONS: °• You may resume your regular medications. °• Please take the pain medication as prescribed. °• Do not take pain medication on an empty stomach. °• Do not drive or drink alcoholic beverages when taking pain medications. ° °CALL THE OFFICE FOR: °• Temperature above 101 degrees °• Excessive bleeding or drainage on the dressing. °• Excessive swelling, coldness, or paleness of the toes. °• Persistent nausea and vomiting. ° °FOLLOW-UP:  °• You should have an appointment to return to the office in 7-10 days after surgery.   ° ° °AMBULATORY SURGERY  °DISCHARGE INSTRUCTIONS ° ° °1) The drugs that you were given will stay in your system until tomorrow so for the next 24 hours you should not: ° °A) Drive an automobile °B) Make any legal decisions °C) Drink any alcoholic beverage ° ° °2) You may resume regular meals tomorrow.  Today it is better to start with liquids and gradually work up to solid foods. ° °You may eat anything you prefer, but it is better to start with liquids, then soup and crackers, and gradually work up to solid foods. ° ° °3) Please notify your doctor immediately if you have any unusual bleeding, trouble breathing, redness and pain at the surgery site, drainage, fever, or pain not relieved by medication. ° ° ° °4) Additional Instructions: ° ° ° ° ° ° ° °Please contact your physician with any problems or Same Day Surgery at [phone], Monday through Friday 6 am to 4 pm, or [facility] [facility] number at [phone]. ° °

## 2019-06-12 NOTE — H&P (Signed)
The patient has been re-examined, and the chart reviewed, and there have been no interval changes to the documented history and physical.    The risks, benefits, and alternatives have been discussed at length. The patient expressed understanding of the risks benefits and agreed with plans for surgical intervention.  James P. Hooten, Jr. M.D.    

## 2019-06-12 NOTE — Anesthesia Procedure Notes (Signed)
Procedure Name: LMA Insertion Date/Time: 06/12/2019 5:23 PM Performed by: Jerrye Noble, CRNA Pre-anesthesia Checklist: Patient identified, Emergency Drugs available, Suction available, Patient being monitored and Timeout performed Patient Re-evaluated:Patient Re-evaluated prior to induction Oxygen Delivery Method: Circle system utilized Preoxygenation: Pre-oxygenation with 100% oxygen Induction Type: IV induction Ventilation: Mask ventilation without difficulty LMA: LMA inserted LMA Size: 4.0 Tube secured with: Tape Dental Injury: Teeth and Oropharynx as per pre-operative assessment

## 2019-06-13 ENCOUNTER — Encounter: Payer: Self-pay | Admitting: Orthopedic Surgery

## 2019-07-26 ENCOUNTER — Other Ambulatory Visit: Payer: Self-pay

## 2019-07-26 DIAGNOSIS — Z20822 Contact with and (suspected) exposure to covid-19: Secondary | ICD-10-CM

## 2019-07-28 LAB — NOVEL CORONAVIRUS, NAA: SARS-CoV-2, NAA: NOT DETECTED

## 2020-11-13 ENCOUNTER — Other Ambulatory Visit
Admission: RE | Admit: 2020-11-13 | Discharge: 2020-11-13 | Disposition: A | Discharge status: Home / Self Care | Payer: Medicare Other | Source: Ambulatory Visit | Attending: Infectious Diseases | Admitting: Infectious Diseases

## 2020-11-13 DIAGNOSIS — E7800 Pure hypercholesterolemia, unspecified: Secondary | ICD-10-CM | POA: Insufficient documentation

## 2020-11-13 DIAGNOSIS — R000 Tachycardia, unspecified: Secondary | ICD-10-CM | POA: Insufficient documentation

## 2020-11-13 DIAGNOSIS — R0789 Other chest pain: Secondary | ICD-10-CM | POA: Diagnosis present

## 2020-11-13 DIAGNOSIS — K219 Gastro-esophageal reflux disease without esophagitis: Secondary | ICD-10-CM | POA: Insufficient documentation

## 2020-11-13 LAB — FIBRIN DERIVATIVES D-DIMER (ARMC ONLY): Fibrin derivatives D-dimer (ARMC): 461.04 ng/mL (FEU) (ref 0.00–499.00)

## 2020-11-13 LAB — TROPONIN I (HIGH SENSITIVITY): Troponin I (High Sensitivity): 4 ng/L (ref <18)

## 2020-11-27 ENCOUNTER — Ambulatory Visit
Admission: RE | Admit: 2020-11-27 | Discharge: 2020-11-27 | Disposition: A | Discharge status: Home / Self Care | Payer: Medicare Other | Source: Ambulatory Visit | Attending: Infectious Diseases | Admitting: Infectious Diseases

## 2020-11-27 ENCOUNTER — Other Ambulatory Visit: Payer: Self-pay | Admitting: Infectious Diseases

## 2020-11-27 ENCOUNTER — Other Ambulatory Visit: Payer: Self-pay

## 2020-11-27 DIAGNOSIS — R0789 Other chest pain: Secondary | ICD-10-CM

## 2020-11-27 DIAGNOSIS — M79661 Pain in right lower leg: Secondary | ICD-10-CM | POA: Insufficient documentation

## 2021-01-25 ENCOUNTER — Other Ambulatory Visit (HOSPITAL_COMMUNITY): Payer: Self-pay | Admitting: Physician Assistant

## 2021-01-25 ENCOUNTER — Other Ambulatory Visit: Payer: Self-pay | Admitting: Physician Assistant

## 2021-01-25 DIAGNOSIS — R079 Chest pain, unspecified: Secondary | ICD-10-CM

## 2021-01-25 DIAGNOSIS — Z136 Encounter for screening for cardiovascular disorders: Secondary | ICD-10-CM

## 2021-01-25 DIAGNOSIS — E7800 Pure hypercholesterolemia, unspecified: Secondary | ICD-10-CM

## 2021-01-28 ENCOUNTER — Other Ambulatory Visit: Payer: Self-pay

## 2021-01-28 ENCOUNTER — Ambulatory Visit
Admission: RE | Admit: 2021-01-28 | Discharge: 2021-01-28 | Disposition: A | Discharge status: Home / Self Care | Payer: Medicare Other | Source: Ambulatory Visit | Attending: Physician Assistant | Admitting: Physician Assistant

## 2021-01-28 DIAGNOSIS — Z136 Encounter for screening for cardiovascular disorders: Secondary | ICD-10-CM | POA: Insufficient documentation

## 2021-01-28 DIAGNOSIS — E7800 Pure hypercholesterolemia, unspecified: Secondary | ICD-10-CM | POA: Insufficient documentation

## 2021-01-28 DIAGNOSIS — R079 Chest pain, unspecified: Secondary | ICD-10-CM | POA: Insufficient documentation

## 2021-04-07 IMAGING — US US EXTREM LOW VENOUS*R*
1 series · 14 of 24 positions shown · non-contrast
Comparison: None.

CLINICAL DATA: Right calf pain

Atypical chest pain
EXAM:
RIGHT LOWER EXTREMITY VENOUS DOPPLER ULTRASOUND
TECHNIQUE: Gray-scale sonography with compression, as well as color and duplex
ultrasound, were performed to evaluate the deep venous system(s)
from the level of the common femoral vein through the popliteal and
proximal calf veins.

[Series 1: us extrem low venous*right* · 0.07mm/px · 14 of 33 slices shown]
[im 1/33]
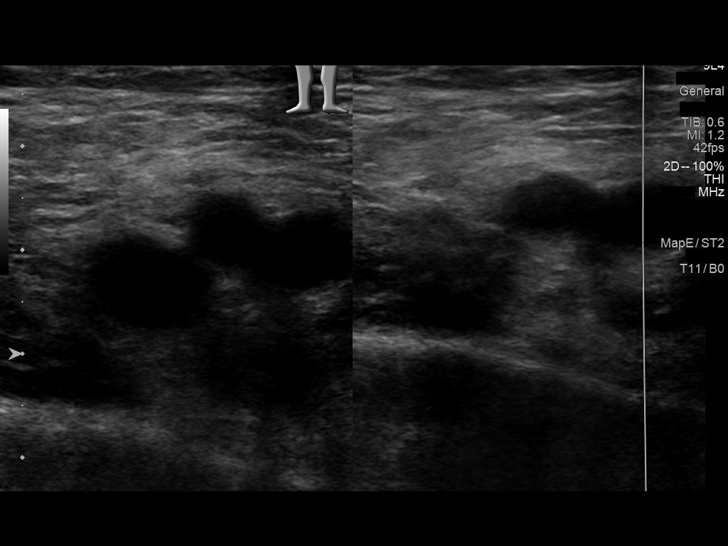
[im 3/33]
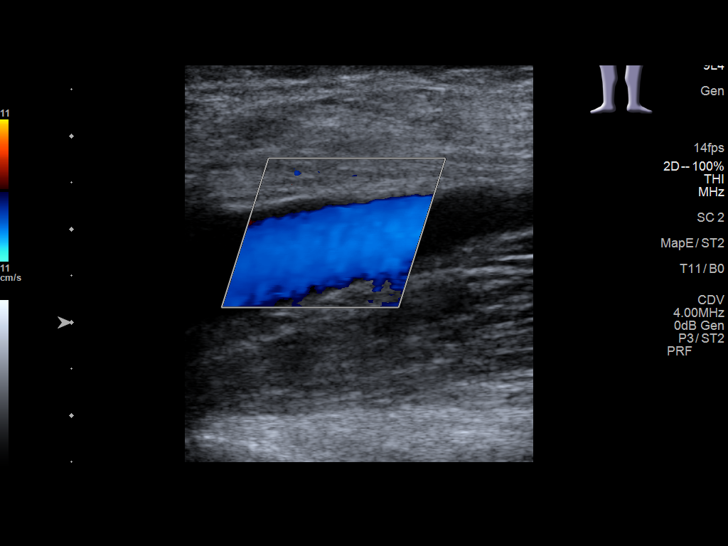
[im 6/33]
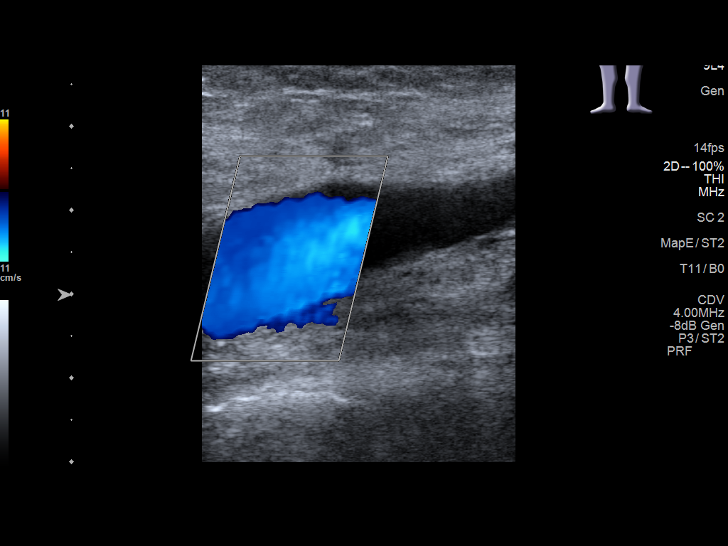
[im 9/33]
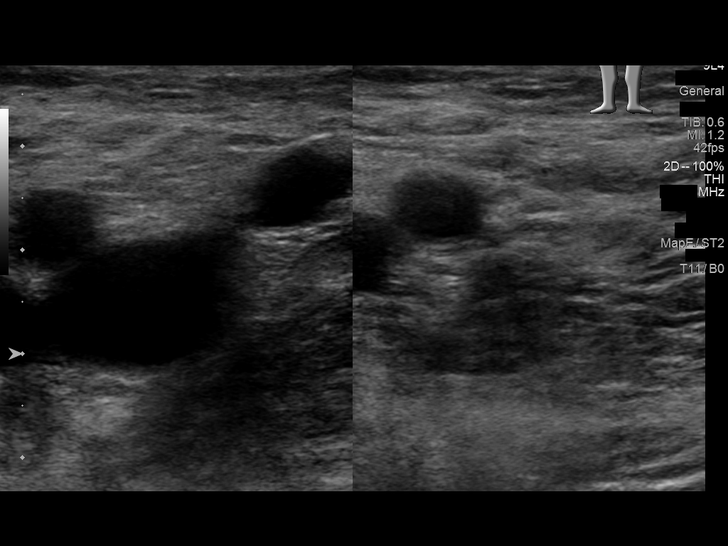
[im 10/33]
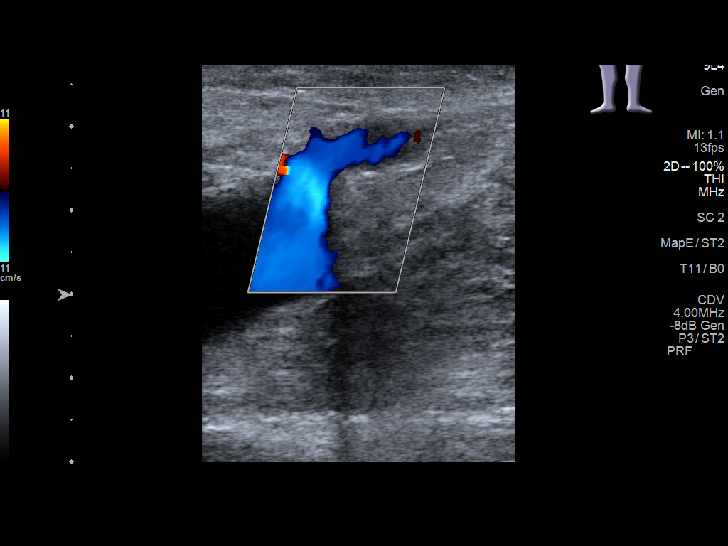
[im 13/33]
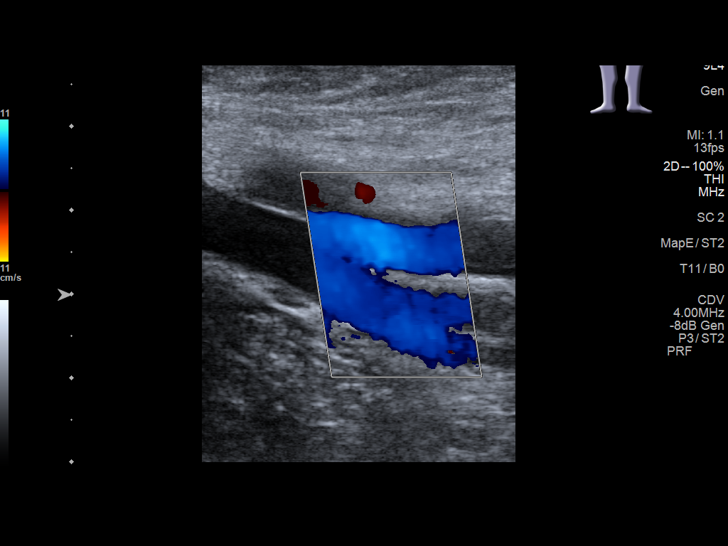
[im 16/33]
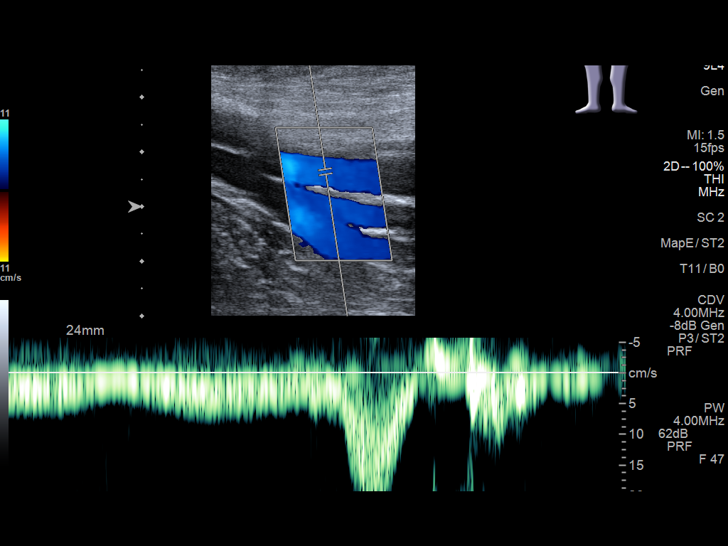
[im 17/33]
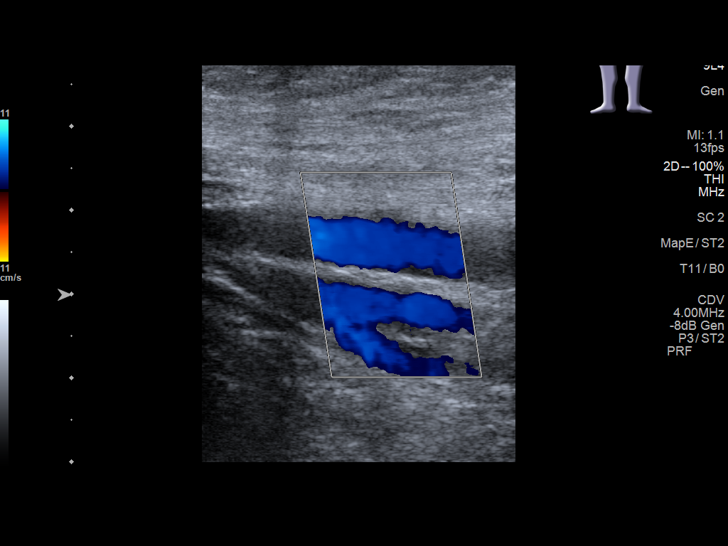
[im 20/33]
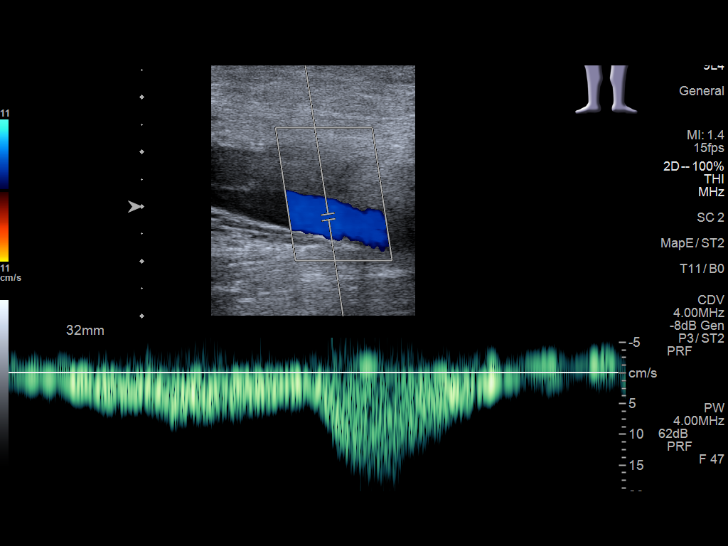
[im 23/33]
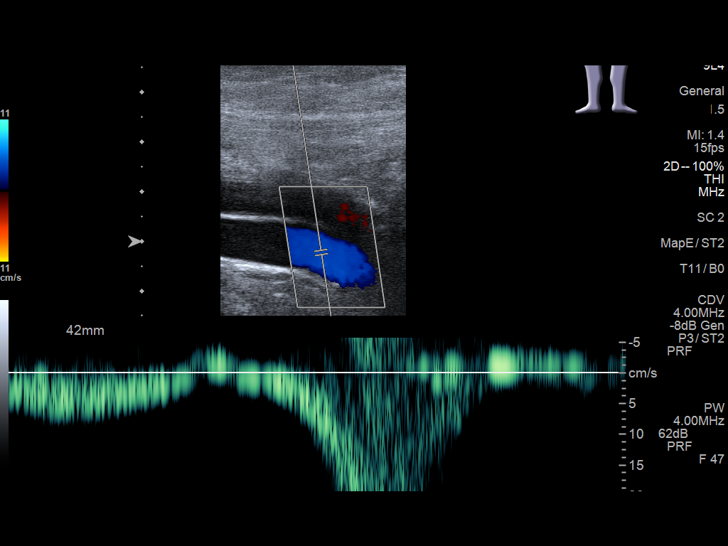
[im 26/33]
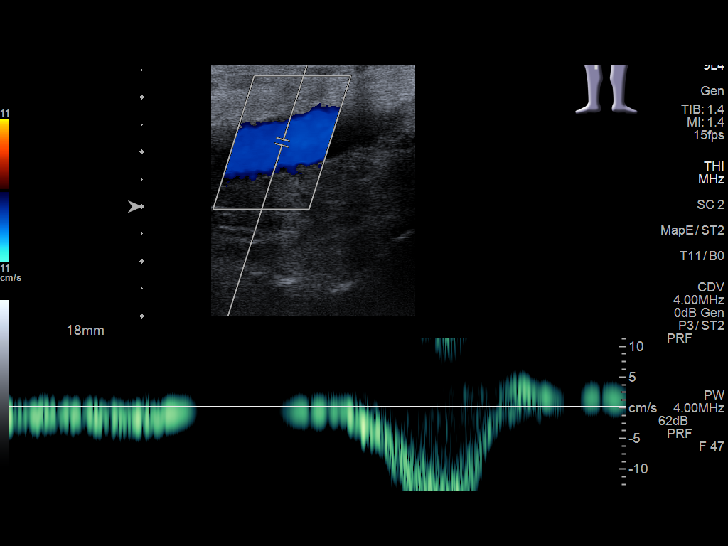
[im 27/33]
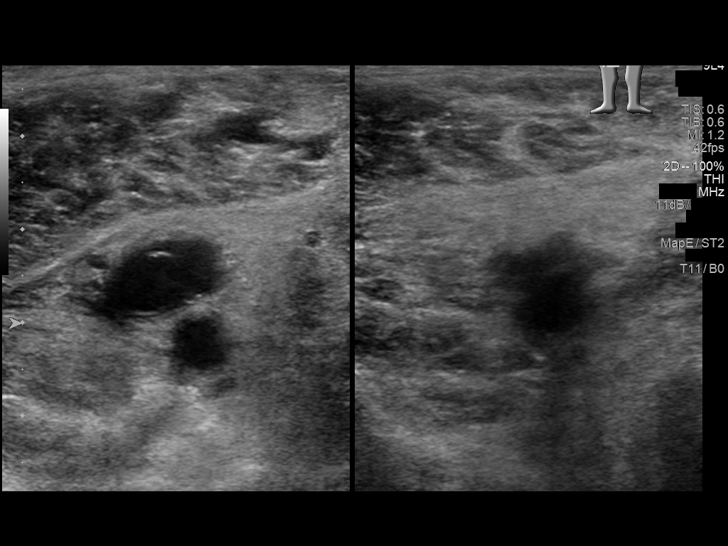
[im 30/33]
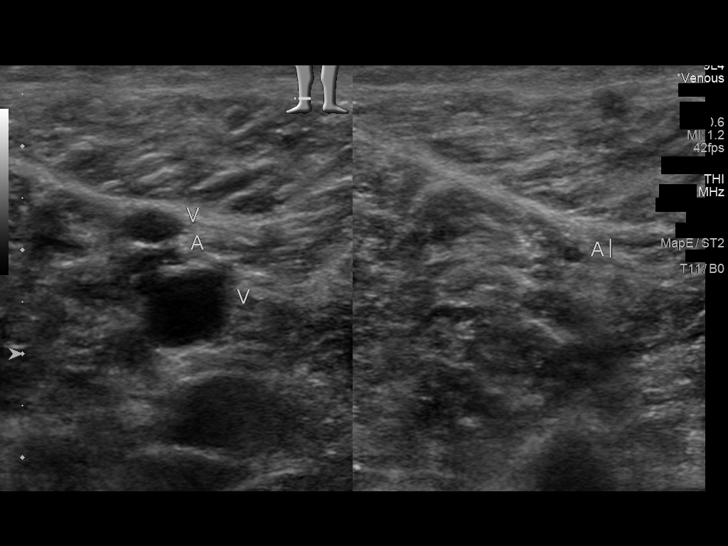
[im 33/33]
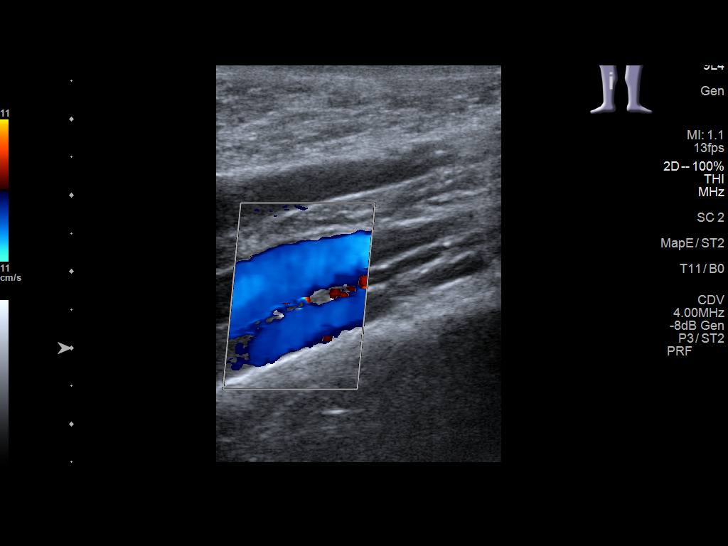

[14 of 24 positions shown; findings below may reference images not displayed]

FINDINGS: VENOUS

Normal compressibility of the common femoral, superficial femoral,
and popliteal veins, as well as the visualized calf veins.
Visualized portions of profunda femoral vein and great saphenous
vein unremarkable. No filling defects to suggest DVT on grayscale or
color Doppler imaging. Doppler waveforms show normal direction of
venous flow, normal respiratory plasticity and response to
augmentation.

Limited views of the contralateral common femoral vein are
unremarkable.

OTHER

None.

Limitations: none
IMPRESSION: Negative.

## 2021-04-21 NOTE — H&P (Signed)
NAME: Dale [name], Dale [name] MEDICAL RECORD NO: [id number] ACCOUNT NO: [id number] DATE OF BIRTH: [birth year] FACILITY: [facility] LOCATION: [facility] PHYSICIAN: Otelia Limes. Yves Dill, MD  History and Physical   DATE OF ADMISSION: 04/19/2021  Same day surgery:  04/29/2021  CHIEF COMPLAINT:  Difficulty voiding.  HISTORY OF PRESENT ILLNESS:  Dale [name] is a 70 year old white male with a long history of BPH LUTS, which is currently being managed with tamsulosin and tadalafil.  Urinary symptoms have worsened in the past year.  IPSS score was 24 with a quality of  life score of 4.  Evaluation in the office included cystoscopy, which revealed trilobar BPH with a 3 cm prostatic urethral length with visual obstruction.  He also had a very small left intravesical median lobe, which was not obstructing.  A prostate  ultrasound indicated a 54.4 mL prostate.  Uroflow study indicated at maximum flow rate of 7 mL per second with an average flow rate of 3.5 mL per second and a postvoid residual of 116 mL based upon his voided volume of 171 mL.  Most recent PSA was 2.3  ng/mL on 03/24/2021.  The patient comes in now for UroLift procedure.  PAST MEDICAL HISTORY:  No drug allergies.  CURRENT MEDICATIONS:  Included carbidopa/levodopa, tamsulosin, tadalafil, omeprazole, testosterone cream, and aspirin.  PAST SURGICAL HISTORY:  Tonsillectomy 1965, LASIK eye surgery 2000, bilateral rotator cuff repair 2015, multiple superficial skin cancer removals 2017.  Past and current medical conditions. 1.  GERD. 2.  Atypical Parkinson's disease.  REVIEW OF SYSTEMS:  The patient denies chest pain, shortness of breath, diabetes, stroke, heart disease or hypertension.  SOCIAL HISTORY:  The patient consumes three to four alcoholic beverages per week .  He denies tobacco use.  FAMILY HISTORY:  Father died at age 47 of a staph infection.  Mother died at age 38 of an aneurysm.  The patient has a brother who died at age 54 of bladder  cancer.  PHYSICAL EXAMINATION:   VITAL SIGNS:  Height was 5 feet 9 inches, weight 177 pounds.   GENERAL: Well nourished white male in no acute distress. HEENT:  Sclerae were clear.  Pupils are equally round, reactive to light and accommodation.  Extraocular movements are intact. NECK:  No palpable masses or tenderness.  No audible carotid bruits.   LYMPHATIC:  No palpable inguinal or cervical adenopathy. LUNGS:  Clear to auscultation. CARDIOVASCULAR:  Regular rhythm and rate. ABDOMEN:  Soft, nontender abdomen.  No CVA tenderness. GENITOURINARY:  Circumcised.  Testes were smooth nontender, 18 mL in size each.   RECTAL EXAM:  Greater than 40 gram smooth, nontender prostate. NEUROLOGIC:  Alert and oriented x 3.  ASSESSMENT AND PLAN:   1.  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 2.  Hypogonadism. 3.  Erectile dysfunction.    PLAN: UroLift procedure.     Elián.Darby D: 04/21/2021 9:51:46 am T: 04/21/2021 10:11:00 am  JOB: [id number]/ [id number]

## 2021-04-23 ENCOUNTER — Other Ambulatory Visit: Payer: Self-pay

## 2021-04-23 ENCOUNTER — Other Ambulatory Visit
Admission: RE | Admit: 2021-04-23 | Discharge: 2021-04-23 | Disposition: A | Discharge status: Home / Self Care | Payer: Medicare Other | Source: Ambulatory Visit | Attending: Urology | Admitting: Urology

## 2021-04-23 HISTORY — DX: COVID-19: U07.1

## 2021-04-23 HISTORY — DX: Angina pectoris, unspecified: I20.9

## 2021-04-23 NOTE — Patient Instructions (Addendum)
Your procedure is scheduled on: 04/29/21 - Thursday Report to the Registration Desk on the 1st floor of the [facility]. To find out your arrival time, please call [phone] between 1PM - 3PM on: 04/28/21 - Wednesday  REMEMBER: Instructions that are not followed completely may result in serious medical risk, up to and including death; or upon the discretion of your surgeon and anesthesiologist your surgery may need to be rescheduled.  Do not eat food after midnight the night before surgery.  No gum chewing, lozengers or hard candies.  You may however, drink CLEAR liquids up to 2 hours before you are scheduled to arrive for your surgery. Do not drink anything within 2 hours of your scheduled arrival time.  Clear liquids include: - water  - apple juice without pulp - gatorade (not RED, PURPLE, OR BLUE) - black coffee or tea (Do NOT add milk or creamers to the coffee or tea) Do NOT drink anything that is not on this list.   TAKE THESE MEDICATIONS THE MORNING OF SURGERY WITH A SIP OF WATER:  - carbidopa-levodopa (PARCOPA) 25-100 MG disintegrating tablet - omeprazole (PRILOSEC) 40 MG capsule  One week prior to surgery: ASPIRIN 81 MG was stopped on 04/20/21. Stop Anti-inflammatories (NSAIDS) such as Advil, Aleve, Ibuprofen, Motrin, Naproxen, Naprosyn and Aspirin based products such as Excedrin, Goodys Powder, BC Powder.  Stop ANY OVER THE COUNTER supplements until after surgery.  You may however, continue to take Tylenol if needed for pain up until the day of surgery.  No Alcohol for 24 hours before or after surgery.  No Smoking including e-cigarettes for 24 hours prior to surgery.  No chewable tobacco products for at least 6 hours prior to surgery.  No nicotine patches on the day of surgery.  Do not use any "recreational" drugs for at least a week prior to your surgery.  Please be advised that the combination of cocaine and anesthesia may have negative outcomes, up to and  including death. If you test positive for cocaine, your surgery will be cancelled.  On the morning of surgery brush your teeth with toothpaste and water, you may rinse your mouth with mouthwash if you wish. Do not swallow any toothpaste or mouthwash.  Do not wear jewelry, make-up, hairpins, clips or nail polish.  Do not wear lotions, powders, or perfumes.   Do not shave body from the neck down 48 hours prior to surgery just in case you cut yourself which could leave a site for infection.  Also, freshly shaved skin may become irritated if using the CHG soap.  Contact lenses, hearing aids and dentures may not be worn into surgery.  Do not bring valuables to the hospital. [facility] is not responsible for any missing/lost belongings or valuables.   Notify your doctor if there is any change in your medical condition (cold, fever, infection).  Wear comfortable clothing (specific to your surgery type) to the hospital.  After surgery, you can help prevent lung complications by doing breathing exercises.  Take deep breaths and cough every 1-2 hours. Your doctor may order a device called an Incentive Spirometer to help you take deep breaths. When coughing or sneezing, hold a pillow firmly against your incision with both hands. This is called "splinting." Doing this helps protect your incision. It also decreases belly discomfort.  If you are being admitted to the hospital overnight, leave your suitcase in the car. After surgery it may be brought to your room.  If you are being  discharged the day of surgery, you will not be allowed to drive home. You will need a responsible adult (18 years or older) to drive you home and stay with you that night.   If you are taking public transportation, you will need to have a responsible adult (18 years or older) with you. Please confirm with your physician that it is acceptable to use public transportation.   Please call the [facility]  at [phone] if you have any questions about these instructions.  Surgery Visitation Policy:  Patients undergoing a surgery or procedure may have one family member or support person with them as long as that person is not COVID-19 positive or experiencing its symptoms.  That person may remain in the waiting area during the procedure.  Inpatient Visitation:    Visiting hours are 7 a.m. to 8 p.m. Inpatients will be allowed two visitors daily. The visitors may change each day during the patient's stay. No visitors under the age of 16. Any visitor under the age of 30 must be accompanied by an adult. The visitor must pass COVID-19 screenings, use hand sanitizer when entering and exiting the patient's room and wear a mask at all times, including in the patient's room. Patients must also wear a mask when staff or their visitor are in the room. Masking is required regardless of vaccination status.

## 2021-04-26 ENCOUNTER — Other Ambulatory Visit
Admission: RE | Admit: 2021-04-26 | Discharge: 2021-04-26 | Disposition: A | Discharge status: Home / Self Care | Payer: Medicare Other | Source: Ambulatory Visit | Attending: Urology | Admitting: Urology

## 2021-04-26 ENCOUNTER — Other Ambulatory Visit: Payer: Self-pay

## 2021-04-26 DIAGNOSIS — Z01818 Encounter for other preprocedural examination: Secondary | ICD-10-CM | POA: Diagnosis present

## 2021-04-26 LAB — BASIC METABOLIC PANEL
Anion gap: 10 (ref 5–15)
BUN: 10 mg/dL (ref 8–23)
CO2: 25 mmol/L (ref 22–32)
Calcium: 8.7 mg/dL — ABNORMAL LOW (ref 8.9–10.3)
Chloride: 102 mmol/L (ref 98–111)
Creatinine, Ser: 0.88 mg/dL (ref 0.61–1.24)
GFR, Estimated: >60 mL/min (ref >60)
Glucose, Bld: 99 mg/dL (ref 70–99)
Potassium: 3.6 mmol/L (ref 3.5–5.1)
Sodium: 137 mmol/L (ref 135–145)

## 2021-04-26 LAB — CBC
HCT: 41.6 % (ref 39.0–52.0)
Hemoglobin: 14.4 g/dL (ref 13.0–17.0)
MCH: 31.9 pg (ref 26.0–34.0)
MCHC: 34.6 g/dL (ref 30.0–36.0)
MCV: 92.2 fL (ref 80.0–100.0)
Platelets: 266 10*3/uL (ref 150–400)
RBC: 4.51 MIL/uL (ref 4.22–5.81)
RDW: 14.1 % (ref 11.5–15.5)
WBC: 5.4 10*3/uL (ref 4.0–10.5)
nRBC: 0 % (ref 0.0–0.2)

## 2021-04-28 MED ORDER — ORAL CARE MOUTH RINSE: 15.0000 mL | Freq: Once | OROMUCOSAL | Status: AC

## 2021-04-28 MED ORDER — CHLORHEXIDINE GLUCONATE 0.12 % MT SOLN: 15.0000 mL | Freq: Once | OROMUCOSAL | Status: AC

## 2021-04-28 MED ORDER — LACTATED RINGERS IV SOLN: INTRAVENOUS | Status: DC

## 2021-04-28 MED ORDER — CEFAZOLIN SODIUM-DEXTROSE 1-4 GM/50ML-% IV SOLN
1.0000 g | Freq: Three times a day (TID) | INTRAVENOUS | Status: DC
  Administered 2021-04-29: 2 g via INTRAVENOUS

## 2021-04-29 ENCOUNTER — Ambulatory Visit: Payer: Medicare Other | Admitting: Anesthesiology

## 2021-04-29 ENCOUNTER — Encounter
Admission: RE | Disposition: A | Discharge status: Home / Self Care | Payer: Self-pay | Source: Home / Self Care | Attending: Urology

## 2021-04-29 ENCOUNTER — Ambulatory Visit
Admission: RE | Admit: 2021-04-29 | Discharge: 2021-04-29 | Disposition: A | Discharge status: Home / Self Care | Payer: Medicare Other | Attending: Urology | Admitting: Urology

## 2021-04-29 ENCOUNTER — Other Ambulatory Visit: Payer: Self-pay

## 2021-04-29 ENCOUNTER — Encounter: Payer: Self-pay | Admitting: Urology

## 2021-04-29 DIAGNOSIS — N138 Other obstructive and reflux uropathy: Secondary | ICD-10-CM | POA: Diagnosis not present

## 2021-04-29 DIAGNOSIS — Z79899 Other long term (current) drug therapy: Secondary | ICD-10-CM | POA: Diagnosis not present

## 2021-04-29 DIAGNOSIS — N529 Male erectile dysfunction, unspecified: Secondary | ICD-10-CM | POA: Diagnosis not present

## 2021-04-29 DIAGNOSIS — N320 Bladder-neck obstruction: Secondary | ICD-10-CM | POA: Insufficient documentation

## 2021-04-29 DIAGNOSIS — E291 Testicular hypofunction: Secondary | ICD-10-CM | POA: Diagnosis not present

## 2021-04-29 DIAGNOSIS — N401 Enlarged prostate with lower urinary tract symptoms: Secondary | ICD-10-CM | POA: Diagnosis not present

## 2021-04-29 HISTORY — PX: CYSTOSCOPY WITH INSERTION OF UROLIFT: SHX6678

## 2021-04-29 SURGERY — CYSTOSCOPY WITH INSERTION OF UROLIFT
Anesthesia: General

## 2021-04-29 MED ORDER — GLYCOPYRROLATE 0.2 MG/ML IJ SOLN
INTRAMUSCULAR | Status: AC
  Filled 2021-04-29: qty 1

## 2021-04-29 MED ORDER — CHLORHEXIDINE GLUCONATE 0.12 % MT SOLN
OROMUCOSAL | Status: AC
  Administered 2021-04-29: 15 mL via OROMUCOSAL
  Filled 2021-04-29: qty 15

## 2021-04-29 MED ORDER — FENTANYL CITRATE (PF) 100 MCG/2ML IJ SOLN
INTRAMUSCULAR | Status: AC
  Filled 2021-04-29: qty 2

## 2021-04-29 MED ORDER — KETOROLAC TROMETHAMINE 30 MG/ML IJ SOLN
INTRAMUSCULAR | Status: DC | PRN
  Administered 2021-04-29: 30 mg via INTRAVENOUS

## 2021-04-29 MED ORDER — PHENYLEPHRINE HCL (PRESSORS) 10 MG/ML IV SOLN
INTRAVENOUS | Status: DC | PRN
  Administered 2021-04-29: 100 ug via INTRAVENOUS

## 2021-04-29 MED ORDER — ONDANSETRON HCL 4 MG/2ML IJ SOLN
INTRAMUSCULAR | Status: AC
  Filled 2021-04-29: qty 2

## 2021-04-29 MED ORDER — FENTANYL CITRATE (PF) 100 MCG/2ML IJ SOLN: 25.0000 ug | INTRAMUSCULAR | Status: DC | PRN

## 2021-04-29 MED ORDER — CIPROFLOXACIN HCL 500 MG PO TABS: 500.0000 mg | ORAL_TABLET | Freq: Two times a day (BID) | ORAL | 0 refills | Status: DC

## 2021-04-29 MED ORDER — DEXAMETHASONE SODIUM PHOSPHATE 10 MG/ML IJ SOLN
INTRAMUSCULAR | Status: DC | PRN
  Administered 2021-04-29: 5 mg via INTRAVENOUS

## 2021-04-29 MED ORDER — PROPOFOL 10 MG/ML IV BOLUS
INTRAVENOUS | Status: AC
  Filled 2021-04-29: qty 20

## 2021-04-29 MED ORDER — LIDOCAINE HCL (PF) 2 % IJ SOLN
INTRAMUSCULAR | Status: AC
  Filled 2021-04-29: qty 5

## 2021-04-29 MED ORDER — DEXAMETHASONE SODIUM PHOSPHATE 10 MG/ML IJ SOLN
INTRAMUSCULAR | Status: AC
  Filled 2021-04-29: qty 1

## 2021-04-29 MED ORDER — BELLADONNA ALKALOIDS-OPIUM 16.2-60 MG RE SUPP
RECTAL | Status: AC
  Filled 2021-04-29: qty 1

## 2021-04-29 MED ORDER — KETOROLAC TROMETHAMINE 30 MG/ML IJ SOLN
INTRAMUSCULAR | Status: AC
  Filled 2021-04-29: qty 1

## 2021-04-29 MED ORDER — ONDANSETRON HCL 4 MG/2ML IJ SOLN
INTRAMUSCULAR | Status: DC | PRN
  Administered 2021-04-29: 4 mg via INTRAVENOUS

## 2021-04-29 MED ORDER — LIDOCAINE HCL URETHRAL/MUCOSAL 2 % EX GEL
CUTANEOUS | Status: AC
  Filled 2021-04-29: qty 10

## 2021-04-29 MED ORDER — CEFAZOLIN SODIUM-DEXTROSE 1-4 GM/50ML-% IV SOLN
INTRAVENOUS | Status: AC
  Filled 2021-04-29: qty 50

## 2021-04-29 MED ORDER — FENTANYL CITRATE (PF) 100 MCG/2ML IJ SOLN
INTRAMUSCULAR | Status: DC | PRN
  Administered 2021-04-29: 100 ug via INTRAVENOUS

## 2021-04-29 MED ORDER — PROPOFOL 10 MG/ML IV BOLUS
INTRAVENOUS | Status: DC | PRN
  Administered 2021-04-29: 200 mg via INTRAVENOUS

## 2021-04-29 MED ORDER — LIDOCAINE HCL URETHRAL/MUCOSAL 2 % EX GEL
CUTANEOUS | Status: DC | PRN
  Administered 2021-04-29: 1

## 2021-04-29 MED ORDER — URIBEL 118 MG PO CAPS: 1.0000 | ORAL_CAPSULE | Freq: Four times a day (QID) | ORAL | 3 refills | Status: DC | PRN

## 2021-04-29 MED ORDER — LIDOCAINE HCL (CARDIAC) PF 100 MG/5ML IV SOSY
PREFILLED_SYRINGE | INTRAVENOUS | Status: DC | PRN
  Administered 2021-04-29: 80 mg via INTRAVENOUS

## 2021-04-29 MED ORDER — OXYCODONE HCL 5 MG PO TABS: 5.0000 mg | ORAL_TABLET | Freq: Once | ORAL | Status: DC | PRN

## 2021-04-29 MED ORDER — OXYCODONE HCL 5 MG/5ML PO SOLN: 5.0000 mg | Freq: Once | ORAL | Status: DC | PRN

## 2021-04-29 SURGICAL SUPPLY — 15 items
BAG DRAIN CYSTO-URO LG1000N (MISCELLANEOUS) ×2 IMPLANT
GAUZE 4X4 16PLY ~~LOC~~+RFID DBL (SPONGE) ×4 IMPLANT
GLOVE SURG ENC MOIS LTX SZ7.5 (GLOVE) ×2 IMPLANT
GOWN STRL REUS W/ TWL LRG LVL3 (GOWN DISPOSABLE) ×1 IMPLANT
GOWN STRL REUS W/ TWL XL LVL3 (GOWN DISPOSABLE) ×1 IMPLANT
GOWN STRL REUS W/TWL LRG LVL3 (GOWN DISPOSABLE) ×2
GOWN STRL REUS W/TWL XL LVL3 (GOWN DISPOSABLE) ×2
KIT TURNOVER CYSTO (KITS) ×2 IMPLANT
MANIFOLD NEPTUNE II (INSTRUMENTS) ×2 IMPLANT
PACK CYSTO AR (MISCELLANEOUS) ×2 IMPLANT
SET CYSTO W/LG BORE CLAMP LF (SET/KITS/TRAYS/PACK) ×2 IMPLANT
SURGILUBE 2OZ TUBE FLIPTOP (MISCELLANEOUS) ×2 IMPLANT
SYSTEM UROLIFT (Male Continence) ×7 IMPLANT
WATER STERILE IRR 1000ML POUR (IV SOLUTION) ×2 IMPLANT
WATER STERILE IRR 3000ML UROMA (IV SOLUTION) ×2 IMPLANT

## 2021-04-29 NOTE — Discharge Instructions (Addendum)
(  [phone] [id number]   Discharge Instructions Following UroLift Transprostatic Implant Treatment   GENERAL EXPECTATIONS   Some men may experience discomfort after the procedure. On occasion, some bloody discharge may be apparent from the penis. You may have soreness in the lower abdomen, and it may be uncomfortable to sit. You may experience the need to urinate more frequently and with greater urgency. These are all normal reactions to the procedure. It is important to take care of yourself the next couple of days to facilitate a speedy recovery. The following are some suggestions:   1.Have someone drive you home after the procedure.   2.Drink plenty of water.   3.Take your medication as prescribed.   4.If you have a catheter placed, do not engage in strenuous activity until your catheter has been removed. You may take     a shower but avoid a bath while you have a catheter.   MEDICATIONS   Take the following medications as directed: _____cipro, uribel_________________________________________________________   When taking pain medications, you may experience dizziness or drowsiness. Do not drink alcohol or drive when you are taking these medications. If you are given an antibiotic to prevent urinary tract infection, it is important to finish all medications as directed.   COMPLICATIONS   You should contact your physician, at [phone]] if you experience any of the   following:   1.Temperature above 101.5 (taken by mouth).   2.Excessive urinary bleeding or bleeding from the penis.   3.Continuous bladder spasms.   4.Painful, swollen and/or inflated testicle(s) or scrotum.   5.Unable to void spontaneously or the indwelling catheter is not draining urine or is blocked.   If you need immediate attention, go to the hospital emergency room for treatment.   Always call your physician before going to the emergency room. If your doctor suggests   that you go to the emergency room or  other facility for catheterization for inability to   urinate, be sure to tell the facility personnel to use a Coud (pronounced coo-day) tipped catheter.   AMBULATORY SURGERY  DISCHARGE INSTRUCTIONS   The drugs that you were given will stay in your system until tomorrow so for the next 24 hours you should not:  Drive an automobile Make any legal decisions Drink any alcoholic beverage   You may resume regular meals tomorrow.  Today it is better to start with liquids and gradually work up to solid foods.  You may eat anything you prefer, but it is better to start with liquids, then soup and crackers, and gradually work up to solid foods.   Please notify your doctor immediately if you have any unusual bleeding, trouble breathing, redness and pain at the surgery site, drainage, fever, or pain not relieved by medication.    Your post-operative visit with Dr.                                       is: Date:                        Time:    Please call to schedule your post-operative visit.  Additional Instructions:

## 2021-04-29 NOTE — H&P (Signed)
Date of Initial H&P: 04/21/21  History reviewed, patient examined, no change in status, stable for surgery.

## 2021-04-29 NOTE — Transfer of Care (Signed)
Immediate Anesthesia Transfer of Care Note  Patient: Dale [name]  Procedure(s) Performed: CYSTOSCOPY WITH INSERTION OF UROLIFT  Patient Location: PACU  Anesthesia Type:General  Level of Consciousness: drowsy  Airway & Oxygen Therapy: Patient Spontanous Breathing and Patient connected to face mask oxygen  Post-op Assessment: Report given to RN  Post vital signs: stable  Last Vitals:  Vitals Value Taken Time  BP 105/66 04/29/21 0900  Temp 36.3 C 04/29/21 0900  Pulse 58 04/29/21 0900  Resp 10 04/29/21 0900  SpO2 98 % 04/29/21 0900  Vitals shown include unvalidated device data.  Last Pain:  Vitals:   04/29/21 0714  TempSrc: Temporal  PainSc: 5          Complications: No notable events documented.

## 2021-04-29 NOTE — Op Note (Signed)
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 Yves Dill MD  Anesthesia: General  Indications:See the history and physical. After informed consent the above procedure(s) were requested     Technique and findings: Patient was brought to the cystoscopy suite and placed into dorsal lithotomy position.  After adequate general anesthesia been obtained the perineum was then sterilely prepped and draped in the usual fashion.  Cystoscopy was performed, and patient was found to have lateral lobe obstruction and a small non-obstructing median lobe at the left bladder neck. The 1st pair of implants were placed 1.5 - 2cm from the bladder neck. The 2nd pair of implants were placed at the level of the verumontanum. A third pair of implants were stacked between the other implants.  A seventh implant was placed on the left side near the apex because there was a slight degree of obstructive tissue still present here.  Cystoscopy was performed at completion, and no injury to the bladder or urethra was detected. The prostatic fossa was widely open.  The cystoscope was then removed and procedure terminated.  10 cc of viscous Xylocaine was instilled within the urethra.  The patient tolerated the procedure well and was transported to the recovery room in stable condition.  Blood loss was minimal.

## 2021-04-29 NOTE — Anesthesia Procedure Notes (Signed)
Procedure Name: LMA Insertion Date/Time: 04/29/2021 8:33 AM Performed by: Lerry Liner, CRNA Pre-anesthesia Checklist: Patient identified, Emergency Drugs available, Suction available and Patient being monitored Patient Re-evaluated:Patient Re-evaluated prior to induction Oxygen Delivery Method: Circle system utilized Preoxygenation: Pre-oxygenation with 100% oxygen Induction Type: IV induction Ventilation: Mask ventilation without difficulty LMA: LMA inserted LMA Size: 4.5 Number of attempts: 1 Tube secured with: Tape Dental Injury: Teeth and Oropharynx as per pre-operative assessment

## 2021-04-29 NOTE — Anesthesia Preprocedure Evaluation (Signed)
Anesthesia Evaluation  Patient identified by MRN, date of birth, ID band Patient awake    Reviewed: Allergy & Precautions, NPO status , Patient's Chart, lab work & pertinent test results  History of Anesthesia Complications Negative for: history of anesthetic complications  Airway Mallampati: I       Dental no notable dental hx.    Pulmonary neg pulmonary ROS, neg sleep apnea, Pneumonia: hx of mycoplasma pnuemonia with recurrent URIs since., neg COPD, Not current smoker,    Pulmonary exam normal        Cardiovascular Exercise Tolerance: Good (-) hypertension(-) Past MI and (-) CHF negative cardio ROS Normal cardiovascular exam(-) dysrhythmias (-) Valvular Problems/Murmurs     Neuro/Psych neg Seizures negative neurological ROS  negative psych ROS   GI/Hepatic Neg liver ROS, GERD  Medicated and Controlled,  Endo/Other  neg diabetes  Renal/GU negative Renal ROS   ENLARGED PROSTATE    Musculoskeletal  (+) Arthritis ,   Abdominal Normal abdominal exam  (+)   Peds  Hematology negative hematology ROS (+)   Anesthesia Other Findings   Reproductive/Obstetrics                             Anesthesia Physical  Anesthesia Plan  ASA: II  Anesthesia Plan: General   Post-op Pain Management:    Induction: Intravenous  PONV Risk Score and Plan: 2 and Ondansetron and Dexamethasone  Airway Management Planned: LMA  Additional Equipment:   Intra-op Plan:   Post-operative Plan:   Informed Consent: I have reviewed the patients History and Physical, chart, labs and discussed the procedure including the risks, benefits and alternatives for the proposed anesthesia with the patient or authorized representative who has indicated his/her understanding and acceptance.     Dental advisory given  Plan Discussed with:   Anesthesia Plan Comments:         Anesthesia Quick Evaluation

## 2021-05-01 NOTE — Anesthesia Postprocedure Evaluation (Signed)
Anesthesia Post Note  Patient: Dale [name]  Procedure(s) Performed: CYSTOSCOPY WITH INSERTION OF UROLIFT  Patient location during evaluation: PACU Anesthesia Type: General Level of consciousness: awake and alert Pain management: pain level controlled Vital Signs Assessment: post-procedure vital signs reviewed and stable Respiratory status: spontaneous breathing, nonlabored ventilation and respiratory function stable Cardiovascular status: blood pressure returned to baseline and stable Postop Assessment: no apparent nausea or vomiting Anesthetic complications: no   No notable events documented.   Last Vitals:  Vitals:   04/29/21 0926 04/29/21 0935  BP:  114/69  Pulse: 68 (!) 58  Resp: 18 18  Temp: 36.5 C (!) 36.1 C  SpO2: 99% 99%    Last Pain:  Vitals:   04/30/21 0922  TempSrc:   PainSc: 0-No pain                 Iran Ouch

## 2021-06-08 IMAGING — CT CT CARDIAC CORONARY ARTERY CALCIUM SCORE
2 series · 15 of 20 positions shown, 17 images · non-contrast
Comparison: Chest CT 05/23/2016.
COMPARISON: Chest CT 05/23/2016.

Addendum:
EXAM:
OVER-READ INTERPRETATION  CT CHEST

The following report is an over-read performed by radiologist Dr.
Suneel Saldana [REDACTED] on 01/28/2021. This
over-read does not include interpretation of cardiac or coronary
anatomy or pathology. The coronary calcium score/coronary CTA
interpretation by the cardiologist is attached.
CLINICAL DATA: Risk stratification
Coronary Calcium Score
TECHNIQUE: The patient was scanned on a Siemens Definition 64 scanner. Axial
non-contrast 3 mm slices were carried out through the heart. The
data set was analyzed on a dedicated work station and scored using
the Agatson method.

[Series 2: casc 3.0 i36f 2 bestdiast 68 % · axial · 0.34mm/px · z∈[-279,-156]mm · 8 of 53 slices shown, 10 images]
[im 6/53  vessel]
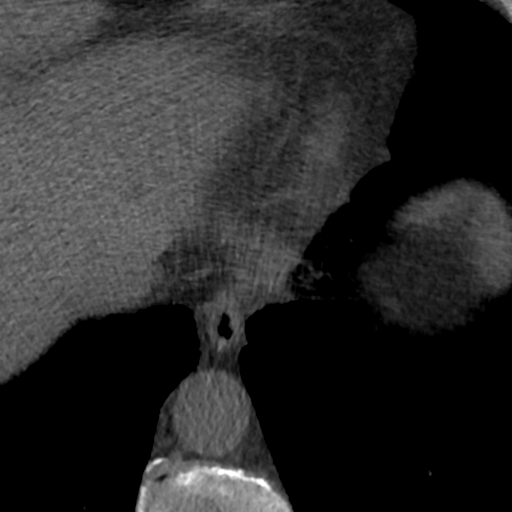
[im 6/53  lung]
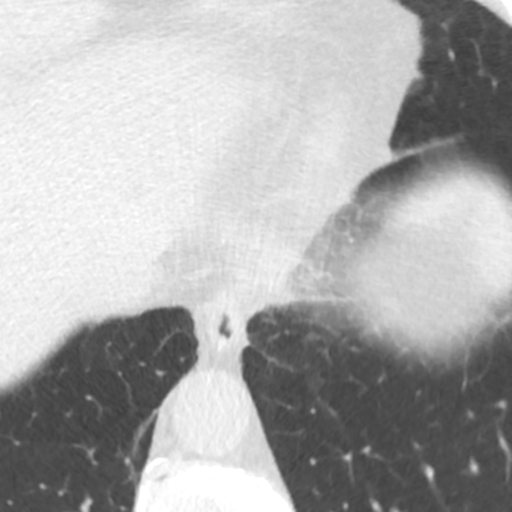
[im 12/53  vessel]
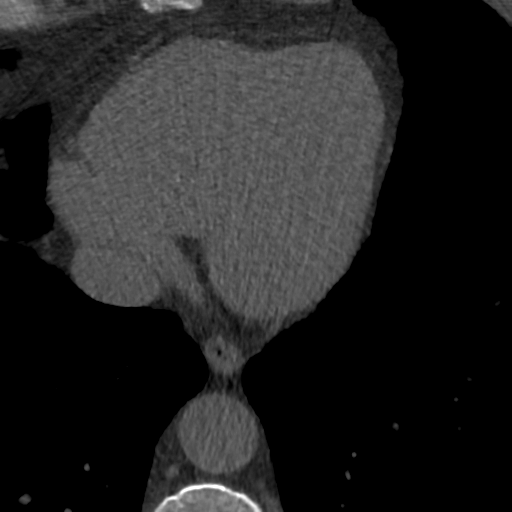
[im 18/53  vessel]
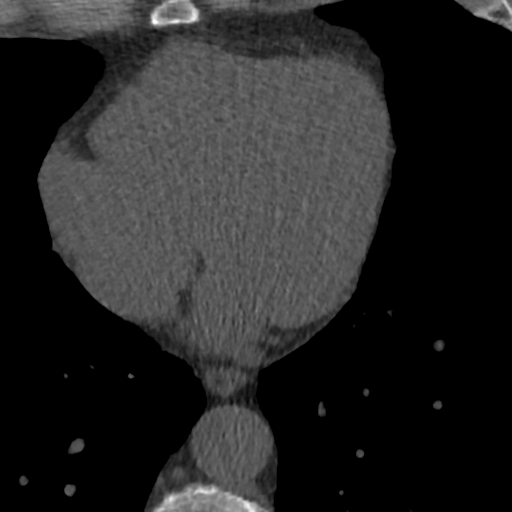
[im 24/53  vessel]
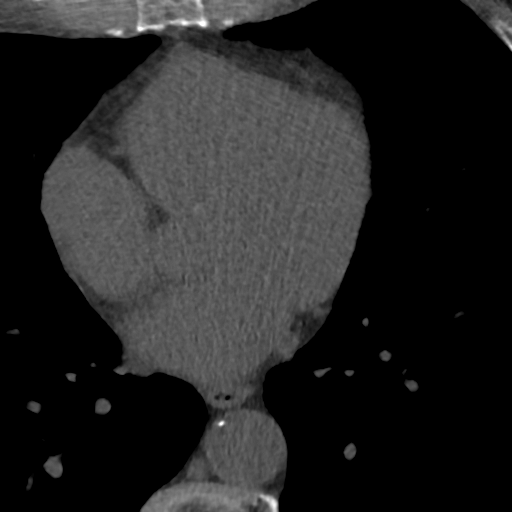
[im 29/53  vessel]
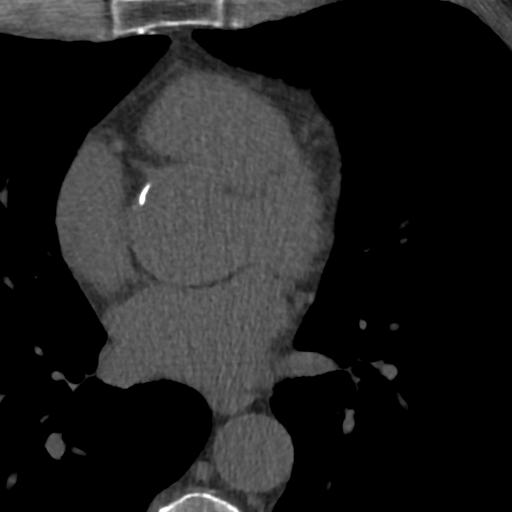
[im 29/53  lung]
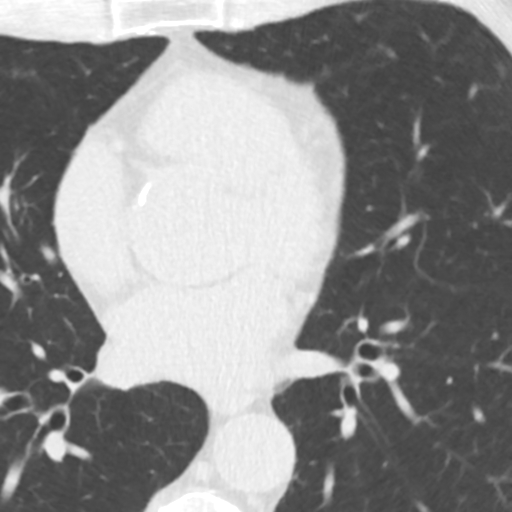
[im 35/53  vessel]
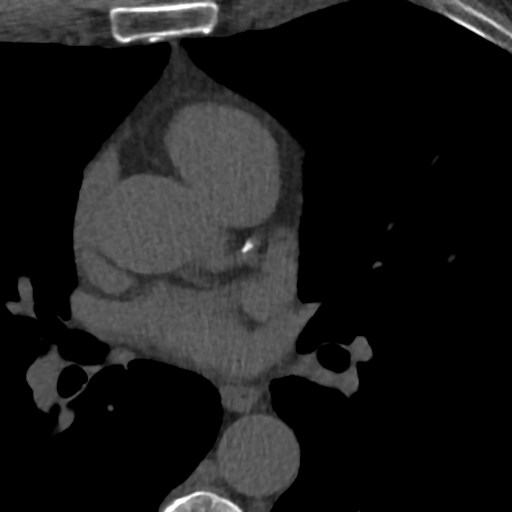
[im 41/53  vessel]
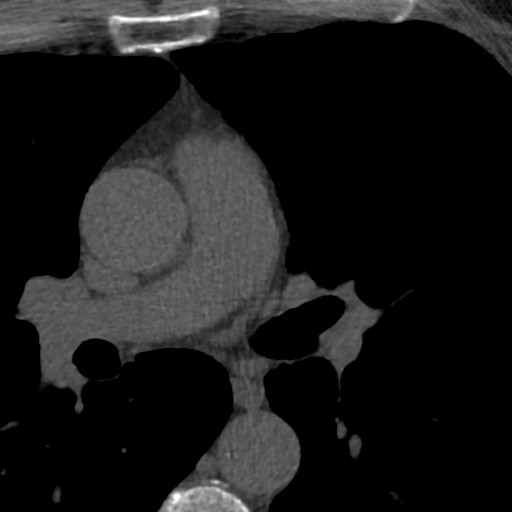
[im 47/53  vessel]
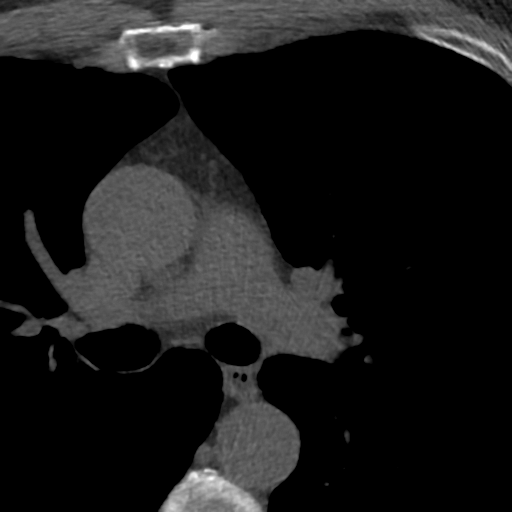

[Series 3: st 67 % · axial · 0.69mm/px · z∈[-279,-174]mm · 7 of 53 slices shown]
[im 6/53  vessel]
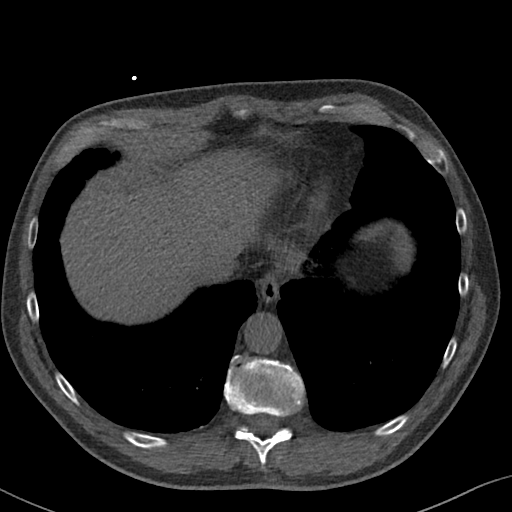
[im 12/53  vessel]
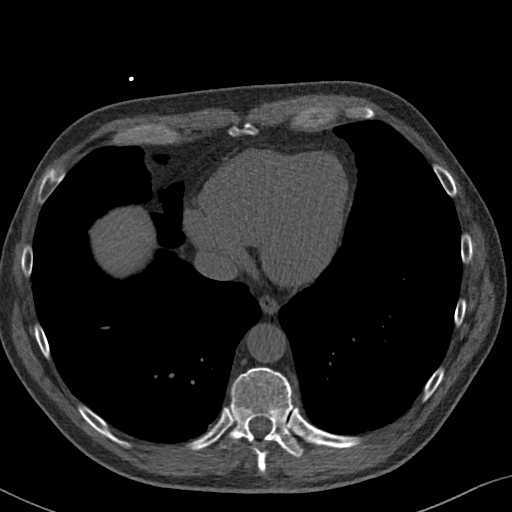
[im 18/53  vessel]
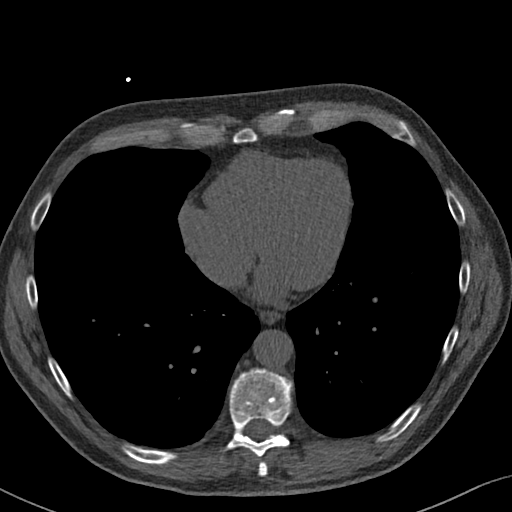
[im 24/53  vessel]
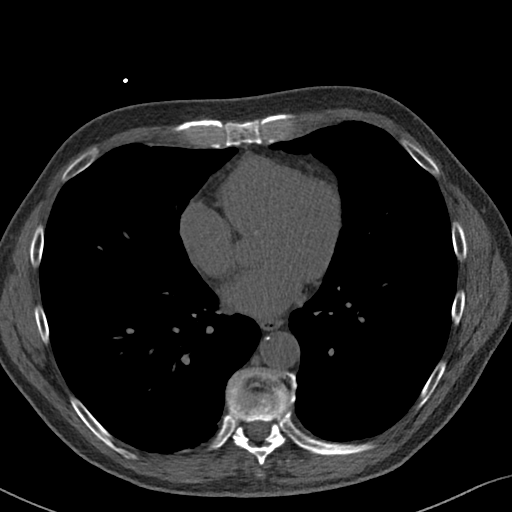
[im 29/53  vessel]
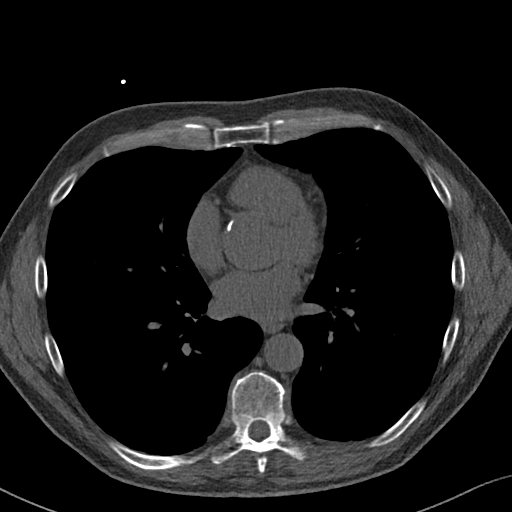
[im 35/53  vessel]
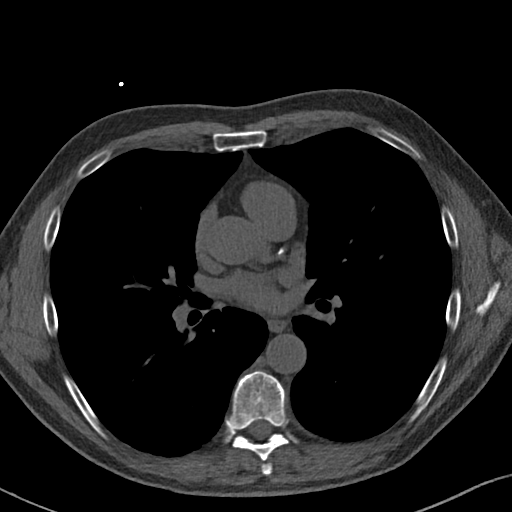
[im 41/53  vessel]
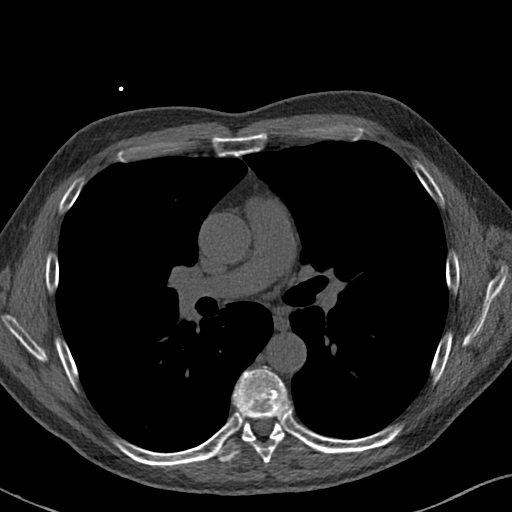

[15 of 20 positions shown; findings below may reference images not displayed]

FINDINGS: Atherosclerotic calcifications in the thoracic aorta. Small
pulmonary nodules are noted in the lungs, largest of which is a 6 x
4 mm (mean diameter 5 mm) right lower lobe pulmonary nodule (axial
image 13 of series 4). Within the visualized portions of the thorax
there are no suspicious appearing pulmonary nodules or masses, there
is no acute consolidative airspace disease, no pleural effusions, no
pneumothorax and no lymphadenopathy. Visualized portions of the
upper abdomen are unremarkable. There are no aggressive appearing
lytic or blastic lesions noted in the visualized portions of the
skeleton.
IMPRESSION: 1. Multiple small pulmonary nodules measuring 5 mm or less in size,
nonspecific, but statistically likely benign. No follow-up needed if
patient is low-risk (and has no known or suspected primary
neoplasm). Non-contrast chest CT can be considered in 12 months if
patient is high-risk. This recommendation follows the consensus
statement: Guidelines for Management of Incidental Pulmonary Nodules
Detected on CT Images: From the [HOSPITAL] 0004; Radiology
2.  Aortic Atherosclerosis (V9DMX-YPN.N).
FINDINGS: Non-cardiac: See separate report from [REDACTED].

Ascending Aorta: Normal size, mild root wall calcifications.

Pericardium: Normal

Coronary arteries: Normal origin of left and right coronary
arteries. Distribution of arterial calcifications if present, as
noted below;

LM 0

LAD

LCx 0

RCA 0

Total
IMPRESSION: 1. Coronary calcium score of 97.2. This was 43rd percentile for age
and sex matched control.

2.  Continue management for cardiovascular risk factors.

3.  Consider asa 81mg if no contraindications.

Reido Vimal

*** End of Addendum ***
EXAM:
OVER-READ INTERPRETATION  CT CHEST

The following report is an over-read performed by radiologist Dr.
Suneel Saldana [REDACTED] on 01/28/2021. This
over-read does not include interpretation of cardiac or coronary
anatomy or pathology. The coronary calcium score/coronary CTA
interpretation by the cardiologist is attached.
FINDINGS: Atherosclerotic calcifications in the thoracic aorta. Small
pulmonary nodules are noted in the lungs, largest of which is a 6 x
4 mm (mean diameter 5 mm) right lower lobe pulmonary nodule (axial
image 13 of series 4). Within the visualized portions of the thorax
there are no suspicious appearing pulmonary nodules or masses, there
is no acute consolidative airspace disease, no pleural effusions, no
pneumothorax and no lymphadenopathy. Visualized portions of the
upper abdomen are unremarkable. There are no aggressive appearing
lytic or blastic lesions noted in the visualized portions of the
skeleton.
IMPRESSION: 1. Multiple small pulmonary nodules measuring 5 mm or less in size,
nonspecific, but statistically likely benign. No follow-up needed if
patient is low-risk (and has no known or suspected primary
neoplasm). Non-contrast chest CT can be considered in 12 months if
patient is high-risk. This recommendation follows the consensus
statement: Guidelines for Management of Incidental Pulmonary Nodules
Detected on CT Images: From the [HOSPITAL] 0004; Radiology
2.  Aortic Atherosclerosis (V9DMX-YPN.N).

## 2022-02-23 ENCOUNTER — Other Ambulatory Visit: Payer: Self-pay | Admitting: Infectious Diseases

## 2022-02-23 ENCOUNTER — Other Ambulatory Visit: Payer: Self-pay | Admitting: Certified Registered Nurse Anesthetist

## 2022-02-23 DIAGNOSIS — R918 Other nonspecific abnormal finding of lung field: Secondary | ICD-10-CM

## 2022-02-28 ENCOUNTER — Ambulatory Visit
Admission: RE | Admit: 2022-02-28 | Discharge: 2022-02-28 | Disposition: A | Discharge status: Home / Self Care | Payer: Medicare Other | Source: Ambulatory Visit | Attending: Infectious Diseases | Admitting: Infectious Diseases

## 2022-02-28 DIAGNOSIS — R918 Other nonspecific abnormal finding of lung field: Secondary | ICD-10-CM

## 2022-03-10 ENCOUNTER — Emergency Department
Admission: EM | Admit: 2022-03-10 | Discharge: 2022-03-10 | Disposition: A | Discharge status: Home / Self Care | Payer: Medicare Other | Attending: Urology | Admitting: Urology

## 2022-03-10 ENCOUNTER — Other Ambulatory Visit: Payer: Self-pay

## 2022-03-10 DIAGNOSIS — N4830 Priapism, unspecified: Secondary | ICD-10-CM | POA: Insufficient documentation

## 2022-03-10 MED ORDER — LIDOCAINE HCL (PF) 1 % IJ SOLN
INTRAMUSCULAR | Status: AC
  Filled 2022-03-10: qty 5

## 2022-03-10 MED ORDER — HYDROMORPHONE HCL 1 MG/ML IJ SOLN
1.0000 mg | Freq: Once | INTRAMUSCULAR | Status: AC
  Administered 2022-03-10: 1 mg via INTRAVENOUS
  Filled 2022-03-10: qty 1

## 2022-03-10 MED ORDER — PHENYLEPHRINE 200 MCG/ML FOR PRIAPISM / HYPOTENSION
500.0000 ug | Freq: Once | INTRAMUSCULAR | Status: AC
Start: 2022-03-10 — End: 2022-03-10
  Administered 2022-03-10: 500 ug via INTRACAVERNOUS
  Filled 2022-03-10: qty 50

## 2022-03-10 MED ORDER — LIDOCAINE HCL (PF) 1 % IJ SOLN
5.0000 mL | Freq: Once | INTRAMUSCULAR | Status: AC
  Filled 2022-03-10: qty 5

## 2022-03-10 NOTE — ED Provider Notes (Addendum)
[facility] Provider Note    Event Date/Time   First MD Initiated Contact with Patient 03/10/22 1756     (approximate)   History   Groin Swelling   HPI  Dale [name] is a 71 y.o. male with a past history of Parkinson disease, hyperlipidemia who is sent to the ED from his urologist due to priapism.  Patient received a test dose of Trimix by Dr. Eliberto Ivory and has had persistent erection since then.  Efforts to resolve the symptoms in the clinic were unsuccessful, so he sent to the ED for intracavernosal phenylephrine injection on cardiac monitor.  Case discussed with Dr. Yves Dill who accompanies the patient to the ED      Physical Exam   Triage Vital Signs: ED Triage Vitals  Enc Vitals Group     BP 03/10/22 1803 (!) 155/95     Pulse --      Resp 03/10/22 1803 16     Temp --      Temp src --      SpO2 03/10/22 1803 98 %     Weight 03/10/22 1804 175 lb (79.4 kg)     Height 03/10/22 1804 '5\' 11"'$  (1.803 m)     Head Circumference --      Peak Flow --      Pain Score 03/10/22 1804 2     Pain Loc --      Pain Edu? --      Excl. in [place]? --     Most recent vital signs: Vitals:   03/10/22 1803  BP: (!) 155/95  Resp: 16  SpO2: 98%    General: Awake, no distress.  Ambulatory, sitting upright Resp:  Normal effort.    ED Results / Procedures / Treatments   Labs (all labs ordered are listed, but only abnormal results are displayed) Labs Reviewed - No data to display   RADIOLOGY    PROCEDURES:  Procedures   MEDICATIONS ORDERED IN ED: Medications  phenylephrine 200 mcg / ml CONC. DILUTION INJ (ED / Urology USE ONLY) (has no administration in time range)  lidocaine (PF) (XYLOCAINE) 1 % injection 5 mL (has no administration in time range)  lidocaine (PF) (XYLOCAINE) 1 % injection (has no administration in time range)  HYDROmorphone (DILAUDID) injection 1 mg (1 mg Intravenous Given 03/10/22 1819)     IMPRESSION / MDM / ASSESSMENT AND PLAN /  ED COURSE  I reviewed the triage vital signs and the nursing notes.                              Patient sent to ED by urology due to priapism. Lidocaine and phenylephrine ordered per urology protocol, instructed by Dr. Eliberto Ivory, who is at bedside to perform injection.  Patient placed on monitor, will be observed in ED until symptoms resolve.  Clinical Course as of 03/10/22 1904  Thu Mar 10, 2022  1903 Pt detumesced. Will monitor until 7:30 for cardiac abnormality on monitor or cardiac sx.  [PS]    Clinical Course User Index [PS] Carrie Mew, MD    ----------------------------------------- 7:28 PM on 03/10/2022 ----------------------------------------- Remained stable, no symptoms, no cardiac events on monitor, stable for discharge   FINAL CLINICAL IMPRESSION(S) / ED DIAGNOSES   Final diagnoses:  Priapism     Rx / DC Orders   ED Discharge Orders     None        Note:  This document was prepared using Dragon voice recognition software and may include unintentional dictation errors.   Carrie Mew, MD 03/10/22 Cato Mulligan    Carrie Mew, MD 03/10/22 Kathyrn Drown

## 2022-03-10 NOTE — Consult Note (Signed)
Urology Consult  Referring physician: Joni Fears Reason for referral: Priapism  Chief Complaint: Priapism  History of Present Illness: 71 year old male with erectile dysfunction who received a test dose of Trimix 0.5 cc at 1130 today and had persistent erection.  He initially tried ice packs and Sudafed as I recommended but the priapism persisted.  Past Medical History:  Diagnosis Date   Anginal pain ([place])    Basal cell carcinoma of skin of other parts of face 01/24/2013   COVID-19    GERD (gastroesophageal reflux disease)    Multilevel degenerative disc disease    L2,3,4 and Cervical   Mycoplasma pneumonia    patient highly susceptible to URI's   Parkinson's disease ([place])    mild   Past Surgical History:  Procedure Laterality Date   COLONOSCOPY WITH PROPOFOL N/A 08/16/2018   Procedure: COLONOSCOPY WITH BIOPSY;  Surgeon: [name], MD;  Location: [facility];  Service: Endoscopy;  Laterality: N/A;   CYSTOSCOPY WITH INSERTION OF UROLIFT N/A 04/29/2021   Procedure: CYSTOSCOPY WITH INSERTION OF UROLIFT;  Surgeon: Royston Cowper, MD;  Location: [facility];  Service: Urology;  Laterality: N/A;   KNEE ARTHROSCOPY Left 06/12/2019   Procedure: ARTHROSCOPY KNEE partial medial lateral menisectomy;  Surgeon: Dereck Leep, MD;  Location: [facility];  Service: Orthopedics;  Laterality: Left;   POLYPECTOMY N/A 08/16/2018   Procedure: POLYPECTOMY;  Surgeon: [name], MD;  Location: [place];  Service: Endoscopy;  Laterality: N/A;   SHOULDER ARTHROSCOPY Bilateral    TONSILLECTOMY      Medications: I have reviewed the patient's current medications. Allergies: No Known Allergies  No family history on file. Social History:  reports that he has never smoked. He has never used smokeless tobacco. He reports current alcohol use of about 6.0 standard drinks per week. He reports that he does not use drugs.  Review of Systems  Physical Exam:  Vital signs in last 24  hours: Pulse Rate:  [66] 66 (06/01 1801) Resp:  [16] 16 (06/01 1803) BP: (155)/(95) 155/95 (06/01 1803) SpO2:  [97 %-98 %] 98 % (06/01 1803) Weight:  [79.4 kg] 79.4 kg (06/01 1804) Physical Exam  Laboratory Data:  No results found for this or any previous visit (from the past 72 hour(s)). No results found for this or any previous visit (from the past 240 hour(s)). Creatinine: No results for input(s): CREATININE in the last 168 hours. Baseline Creatinine: Unknown  Impression/Assessment:  Priapism  Plan:  See the operative report  Royston Cowper 03/10/2022, 7:13 PM

## 2022-03-10 NOTE — ED Triage Notes (Signed)
Trimex injection given in the urology office prior to noon and cont to have an erection despite medication attempt at home

## 2022-03-10 NOTE — Op Note (Signed)
Preoperative diagnosis: Priapism (N48.3)  Postoperative diagnosis: Same  Procedure: 1.  Corporal cavernosal injection with phenylephrine (CPT 54235)                     2.  Penile block with 1% Xylocaine (CPT [phone])  Surgeon: Otelia Limes. Yves Dill MD  Anesthesia: Penile block  Indications:See the consultation also.  71 year old (date of birth 1951-07-30) male with erectile dysfunction who received a test dose of Trimix 0.5 cc at 1130 today and had persistent erection.  He initially tried ice packs and Sudafed as I recommended but the priapism persisted. After informed consent the above procedure(s) were requested.     Technique and findings: The patient was placed on telemetry.  Intravenous access was obtained.  He was given 1 mg of Dilaudid IV. The penis was then prepped and draped in usual fashion. Penile block was performed with 15 cc of 1% Xylocaine.  After adequate local anesthesia been obtained 1 mL (500 mcg) of phenylephrine was injected into the left corpus cavernosum.  After 5 minutes detumescence did not occur and another milliliter of phenylephrine was injected into the right corpus cavernosum.  After 5 minutes detumescence did not occur and a third dose of 1 milliliter phenylephrine was injected into the left corpus cavernosum.  At this point detumescence did occur.  The patient was monitored for an hour in the emergency room without complications and then discharged per Dr. Joni Fears protocol.  He was instructed to contact the office in the morning for further follow-up.

## 2022-07-09 IMAGING — CT CT CHEST W/O CM
1 series · 15 of 34 positions shown, 19 images · non-contrast
Comparison: CT chest coronary angiogram 01/28/2021, CT chest,
05/23/2016

CLINICAL DATA: Follow-up pulmonary nodules



[Series 2: chest w/o 2mm st · axial · non-contrast · 0.90mm/px · z∈[-371,-67]mm · 15 of 180 slices shown, 19 images]
[im 14/180  mediastinal]
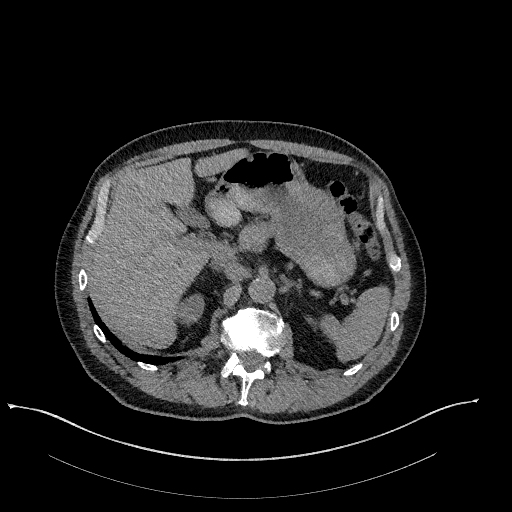
[im 14/180  lung]
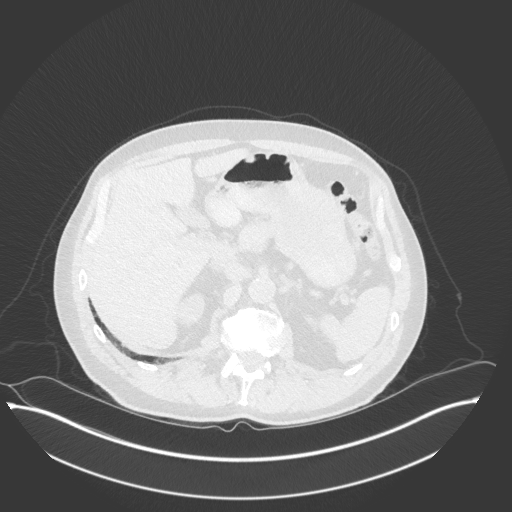
[im 27/180  lung]
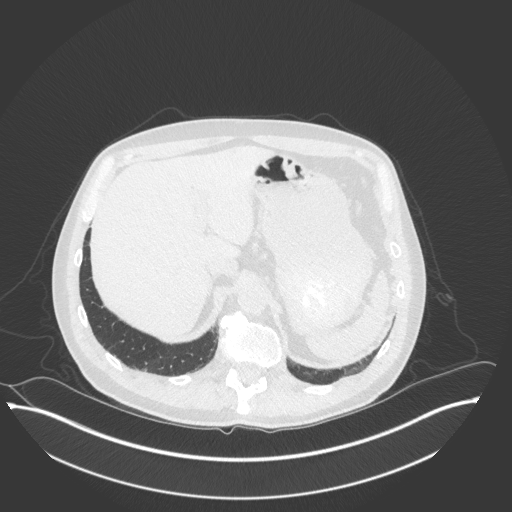
[im 36/180  lung]
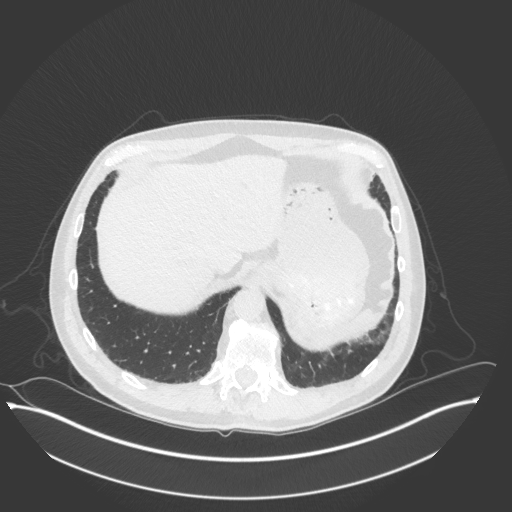
[im 47/180  lung]
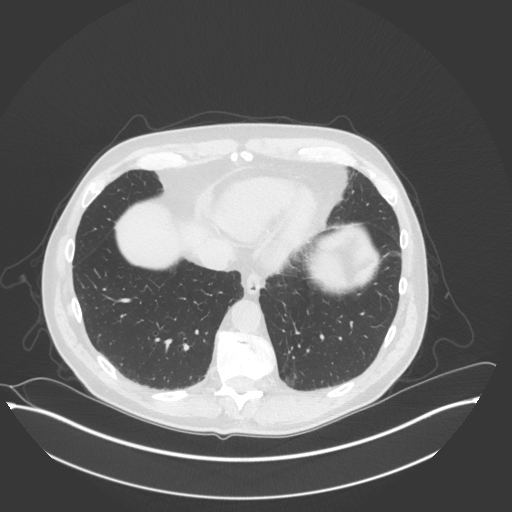
[im 60/180  mediastinal]
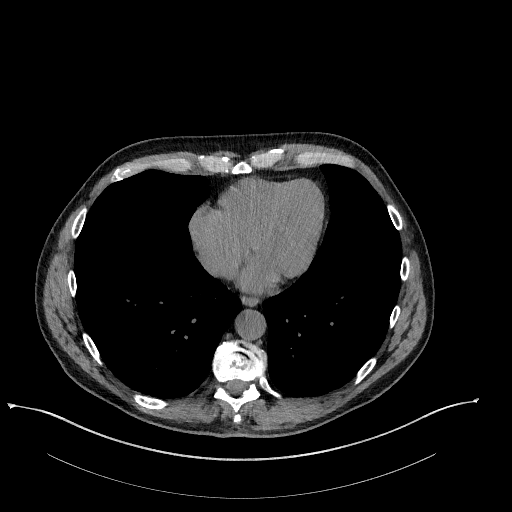
[im 60/180  lung]
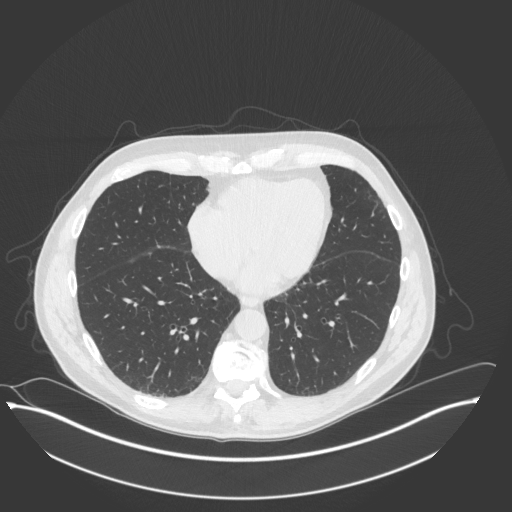
[im 72/180  lung]
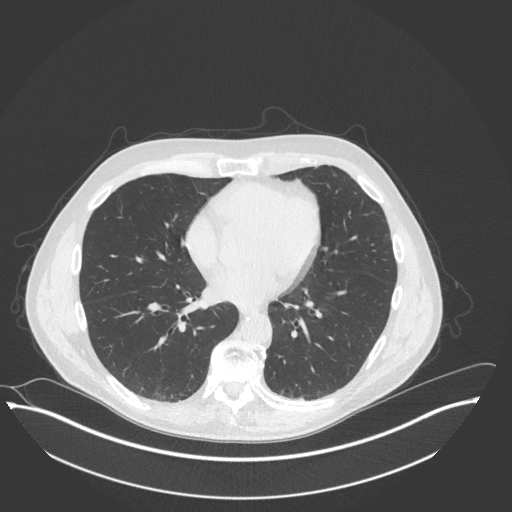
[im 80/180  lung]
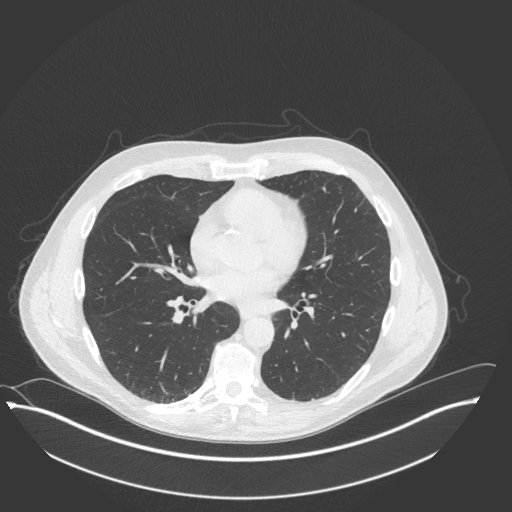
[im 93/180  lung]
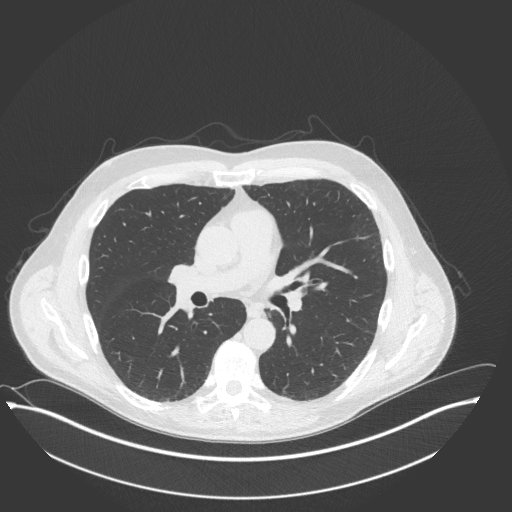
[im 100/180  mediastinal]
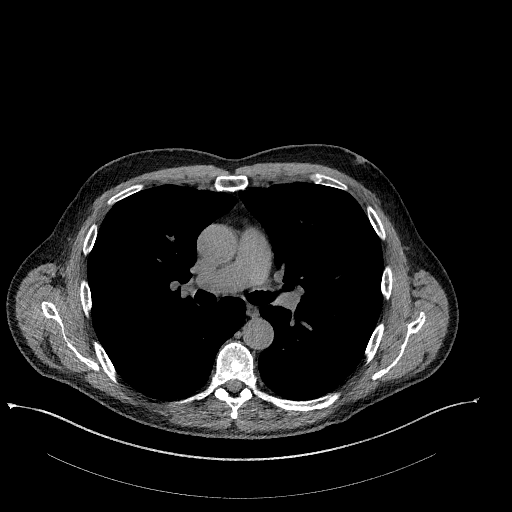
[im 100/180  lung]
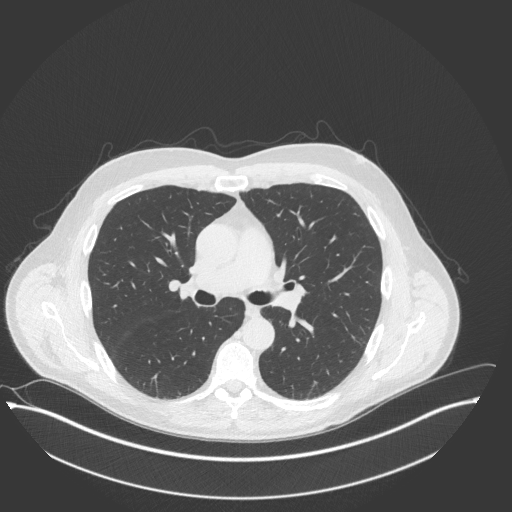
[im 108/180  lung]
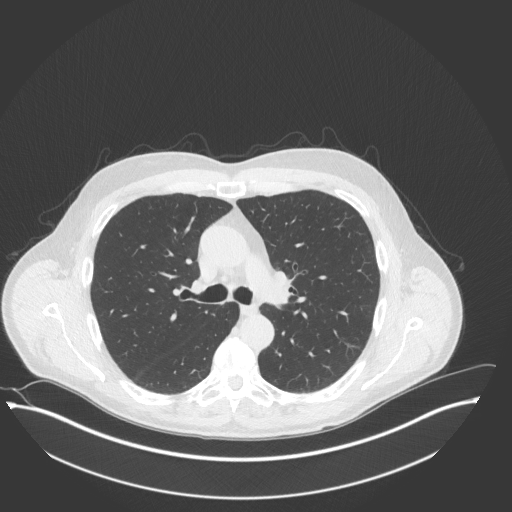
[im 120/180  lung]
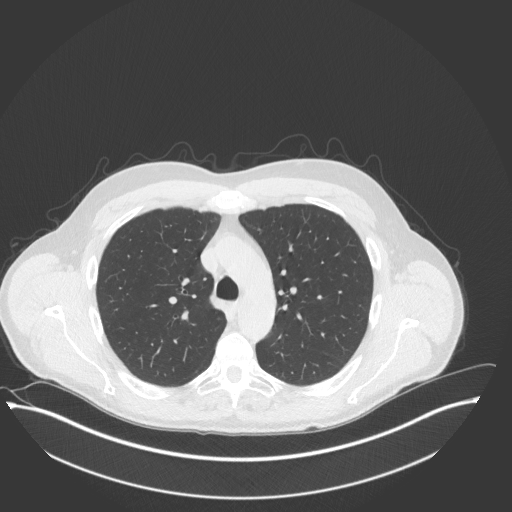
[im 133/180  lung]
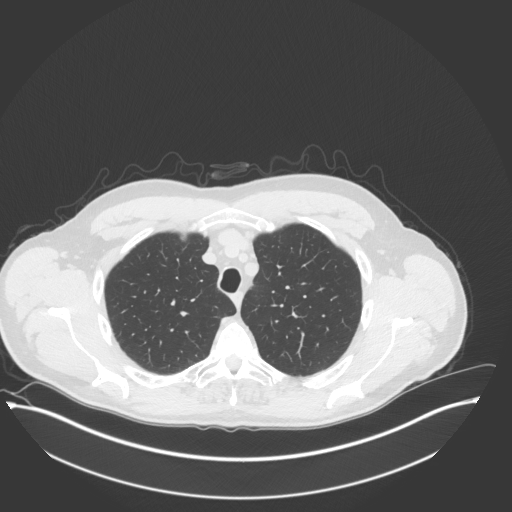
[im 144/180  mediastinal]
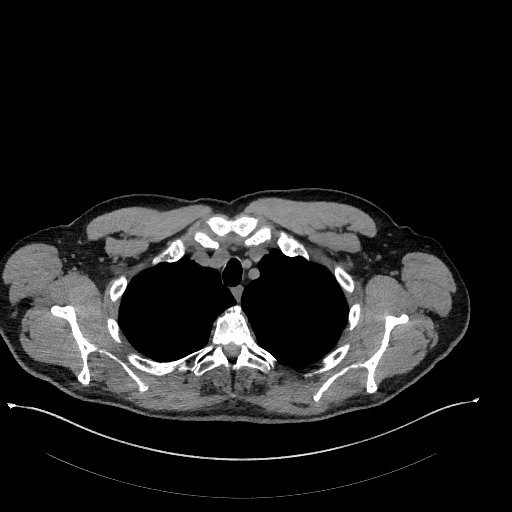
[im 144/180  lung]
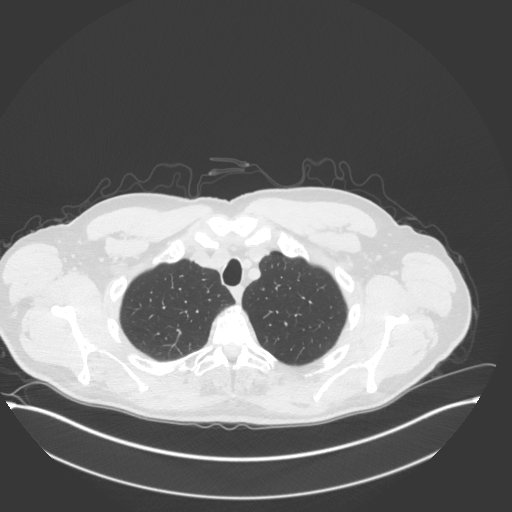
[im 153/180  lung]
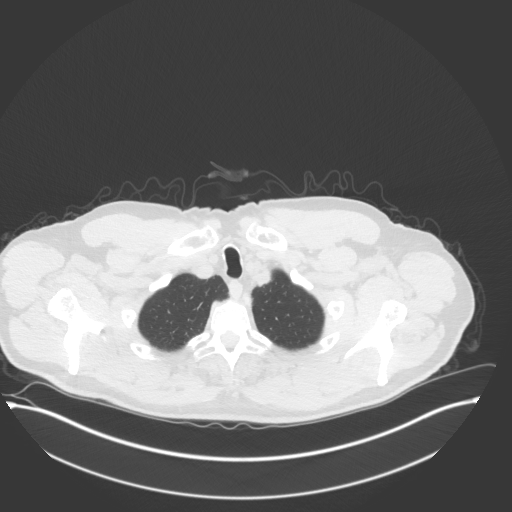
[im 166/180  lung]
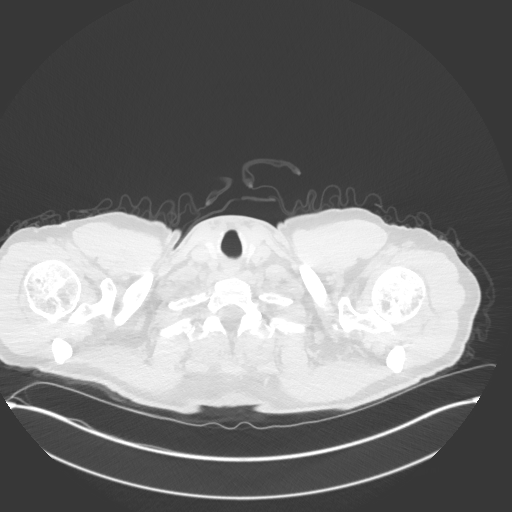

[15 of 34 positions shown; findings below may reference images not displayed]

FINDINGS: Cardiovascular: Aortic atherosclerosis. Normal heart size. Left and
right coronary artery calcifications. No pericardial effusion.

Mediastinum/Nodes: No enlarged mediastinal, hilar, or axillary lymph
nodes. Thyroid gland, trachea, and esophagus demonstrate no
significant findings.

Lungs/Pleura: Lungs are clear. A small pulmonary nodule previously
seen in the dependent right lower lobe is resolved. Unchanged,
benign, triangular fissural lymph node of the anterior right lower
lobe abutting the major fissure measuring 0.5 cm (series 3, image
97). No pleural effusion or pneumothorax.

Upper Abdomen: No acute abnormality.

Musculoskeletal: No chest wall abnormality. No suspicious osseous
lesions identified.
IMPRESSION: 1. A small pulmonary nodule previously seen in the dependent right
lower lobe is resolved. Unchanged, definitively benign fissural
lymph node of the anterior right lower lobe. No further follow-up or
characterization is required.
2. Coronary artery disease.

Aortic Atherosclerosis ([id number]).

## 2022-08-31 ENCOUNTER — Other Ambulatory Visit: Payer: Self-pay | Admitting: Infectious Diseases

## 2022-08-31 DIAGNOSIS — E7800 Pure hypercholesterolemia, unspecified: Secondary | ICD-10-CM

## 2022-08-31 DIAGNOSIS — K219 Gastro-esophageal reflux disease without esophagitis: Secondary | ICD-10-CM

## 2022-08-31 DIAGNOSIS — E871 Hypo-osmolality and hyponatremia: Secondary | ICD-10-CM

## 2022-08-31 DIAGNOSIS — G20C Parkinsonism, unspecified: Secondary | ICD-10-CM

## 2022-08-31 DIAGNOSIS — I2584 Coronary atherosclerosis due to calcified coronary lesion: Secondary | ICD-10-CM

## 2022-09-02 ENCOUNTER — Ambulatory Visit (HOSPITAL_COMMUNITY)
Admission: RE | Admit: 2022-09-02 | Discharge: 2022-09-02 | Disposition: A | Discharge status: Home / Self Care | Payer: Medicare Other | Source: Ambulatory Visit | Attending: Infectious Diseases | Admitting: Infectious Diseases

## 2022-09-02 DIAGNOSIS — E7800 Pure hypercholesterolemia, unspecified: Secondary | ICD-10-CM

## 2022-09-02 DIAGNOSIS — I2584 Coronary atherosclerosis due to calcified coronary lesion: Secondary | ICD-10-CM

## 2022-09-02 DIAGNOSIS — K219 Gastro-esophageal reflux disease without esophagitis: Secondary | ICD-10-CM

## 2022-09-02 DIAGNOSIS — E871 Hypo-osmolality and hyponatremia: Secondary | ICD-10-CM

## 2022-09-02 DIAGNOSIS — G20C Parkinsonism, unspecified: Secondary | ICD-10-CM

## 2022-11-22 ENCOUNTER — Ambulatory Visit (INDEPENDENT_AMBULATORY_CARE_PROVIDER_SITE_OTHER): Payer: Medicare Other | Admitting: Urology

## 2022-11-22 VITALS — BP 134/81 | HR 82 | Ht 70.0 in | Wt 177.0 lb

## 2022-11-22 DIAGNOSIS — N529 Male erectile dysfunction, unspecified: Secondary | ICD-10-CM | POA: Diagnosis not present

## 2022-11-22 DIAGNOSIS — N400 Enlarged prostate without lower urinary tract symptoms: Secondary | ICD-10-CM

## 2022-11-22 DIAGNOSIS — E291 Testicular hypofunction: Secondary | ICD-10-CM | POA: Diagnosis not present

## 2022-11-22 DIAGNOSIS — N401 Enlarged prostate with lower urinary tract symptoms: Secondary | ICD-10-CM

## 2022-11-22 LAB — MICROSCOPIC EXAMINATION

## 2022-11-22 LAB — URINALYSIS, COMPLETE
Bilirubin, UA: NEGATIVE
Glucose, UA: NEGATIVE
Ketones, UA: NEGATIVE
Leukocytes,UA: NEGATIVE
Nitrite, UA: NEGATIVE
Protein,UA: NEGATIVE
RBC, UA: NEGATIVE
Specific Gravity, UA: 1.015 (ref 1.005–1.030)
Urobilinogen, Ur: 0.2 mg/dL (ref 0.2–1.0)
pH, UA: 7 (ref 5.0–7.5)

## 2022-11-22 LAB — BLADDER SCAN AMB NON-IMAGING: Scan Result: 0

## 2022-11-22 NOTE — Progress Notes (Signed)
I,Amy L Pierron,acting as a scribe for Hollice Espy, MD.,have documented all relevant documentation on the behalf of Hollice Espy, MD,as directed by  Hollice Espy, MD while in the presence of Hollice Espy, MD.  11/22/22 11:44 AM   Dale [name] [birth year] [id number]  Referring provider: Leonel Ramsay, MD [place],  [place] [address]  Chief Complaint  Patient presents with   Benign Prostatic Hypertrophy   Hypogonadism    HPI: 72 year-old male with a personal history of hypogonadism, erectile dysfunction, and BPH with LUTS who presents today to establish care.   He was previously a patient of Dr. Yves Dill, last seen on 08/2022.   Notably he has a long standing history of hypogonadism managed with testosterone cream 20%, 1 cc BID with symptomatic relief.   He underwent urolift in 2022. He also has a personal history of Parkinson's disease.  Previously on flomax.  To manage the erectile dysfunction it looks like he was on Cialis and recently switched over to Trimix 0.1 cc per dose. He did have an episode of priapism after the test dose, but seems to have managed well since then.   His most recent PSA was 2.5 in 08/2022. He also had a CBC at that time which was unremarkable. His testosterone level was 552.   His urinary complaints are sometimes he has urgency, sometimes he has weak stream, but not always. He's no longer on Flomax.  He is mindful of his caffeine intake.  He mentioned experimenting with the amount of Trimix to use in order to get the desired result.  He uses a compounded cream because he was under the impression it is the most cost effective but he is open to trying a different type and using his insurance for it.   Results for orders placed or performed in visit on 11/22/22  Microscopic Examination   Urine  Result Value Ref Range   WBC, UA 0-5 0 - 5 /hpf   RBC, Urine 0-2 0 - 2 /hpf   Epithelial Cells (non renal) 0-10 0 - 10 /hpf    Casts Present (A) None seen /lpf   Cast Type Hyaline casts N/A   Bacteria, UA Few None seen/Few  Urinalysis, Complete  Result Value Ref Range   Specific Gravity, UA 1.015 1.005 - 1.030   pH, UA 7.0 5.0 - 7.5   Color, UA Yellow Yellow   Appearance Ur Clear Clear   Leukocytes,UA Negative Negative   Protein,UA Negative Negative/Trace   Glucose, UA Negative Negative   Ketones, UA Negative Negative   RBC, UA Negative Negative   Bilirubin, UA Negative Negative   Urobilinogen, Ur 0.2 0.2 - 1.0 mg/dL   Nitrite, UA Negative Negative   Microscopic Examination See below:   Bladder Scan (Post Void Residual) in office  Result Value Ref Range   Scan Result 0 ml      IPSS     Row Name 11/22/22 0900         International Prostate Symptom Score   How often have you had the sensation of not emptying your bladder? Less than 1 in 5     How often have you had to urinate less than every two hours? Less than half the time     How often have you found you stopped and started again several times when you urinated? Less than half the time     How often have you found it difficult to postpone urination? Less  than 1 in 5 times     How often have you had a weak urinary stream? Less than 1 in 5 times     How often have you had to strain to start urination? Less than 1 in 5 times     How many times did you typically get up at night to urinate? 2 Times     Total IPSS Score 10       Quality of Life due to urinary symptoms   If you were to spend the rest of your life with your urinary condition just the way it is now how would you feel about that? Pleased            Score:  1-7 Mild 8-19 Moderate 20-35 Severe    PMH: Past Medical History:  Diagnosis Date   Anginal pain (HCC)    Basal cell carcinoma of skin of other parts of face 01/24/2013   COVID-19    GERD (gastroesophageal reflux disease)    Multilevel degenerative disc disease    L2,3,4 and Cervical   Mycoplasma pneumonia    patient  highly susceptible to URI's   Parkinson's disease ([place])    mild    Surgical History: Past Surgical History:  Procedure Laterality Date   COLONOSCOPY WITH PROPOFOL N/A 08/16/2018   Procedure: COLONOSCOPY WITH BIOPSY;  Surgeon: [name], MD;  Location: [place];  Service: Endoscopy;  Laterality: N/A;   CYSTOSCOPY WITH INSERTION OF UROLIFT N/A 04/29/2021   Procedure: CYSTOSCOPY WITH INSERTION OF UROLIFT;  Surgeon: Royston Cowper, MD;  Location: [facility];  Service: Urology;  Laterality: N/A;   KNEE ARTHROSCOPY Left 06/12/2019   Procedure: ARTHROSCOPY KNEE partial medial lateral menisectomy;  Surgeon: Dereck Leep, MD;  Location: [facility];  Service: Orthopedics;  Laterality: Left;   POLYPECTOMY N/A 08/16/2018   Procedure: POLYPECTOMY;  Surgeon: [name], MD;  Location: [facility];  Service: Endoscopy;  Laterality: N/A;   SHOULDER ARTHROSCOPY Bilateral    TONSILLECTOMY      Home Medications:  Allergies as of 11/22/2022       Reactions   Other Other (See Comments)        Medication List        Accurate as of November 22, 2022 11:44 AM. If you have any questions, ask your nurse or doctor.          STOP taking these medications    cetirizine 10 MG tablet Commonly known as: ZYRTEC   ciprofloxacin 500 MG tablet Commonly known as: CIPRO   HYDROcodone-acetaminophen 5-325 MG tablet Commonly known as: Norco   tadalafil 5 MG tablet Commonly known as: CIALIS   tamsulosin 0.4 MG Caps capsule Commonly known as: FLOMAX   Uribel 118 MG Caps       TAKE these medications    aspirin EC 81 MG tablet Take 81 mg by mouth daily. Swallow whole.   carbidopa-levodopa 25-100 MG disintegrating tablet Commonly known as: PARCOPA Take 1 tablet by mouth 4 (four) times daily.   omeprazole 40 MG capsule Commonly known as: PRILOSEC Take 40 mg by mouth every morning.   Testosterone 20 % Crea Apply 1 mL topically in the morning and at bedtime. To back  of knee   triamcinolone 55 MCG/ACT Aero nasal inhaler Commonly known as: NASACORT Place 1-2 sprays into the nose daily as needed (allergies).        Allergies:  Allergies  Allergen Reactions   Other Other (See Comments)  Social History:  reports that he has never smoked. He has never used smokeless tobacco. He reports current alcohol use of about 6.0 standard drinks of alcohol per week. He reports that he does not use drugs.   Physical Exam: BP 134/81   Pulse 82   Ht 5' 10"$  (1.778 m)   Wt 177 lb (80.3 kg)   BMI 25.40 kg/m   Constitutional:  Alert and oriented, No acute distress. HEENT: [place] AT, moist mucus membranes.  Trachea midline, no masses. Neurologic: Grossly intact, no focal deficits, moving all 4 extremities. Psychiatric: Normal mood and affect.  Assessment & Plan:    Hypogonadism  - He's been managed stably for multiple years on topical testosterone. He currently is on a compounded formulation and paying out of pocket for this. He may be interested in switching over to insurance and trying an alternative such as AndroGel. If we elect to do that we'll recheck his testosterone three months after making the transition.  2.  Erectile dysfunction   - Using .14 cc of Trimix successfully. He'll let us know when he's due for a refill and will transition his prescription over to a local compounding pharmacy in [place].  3. BPH   - Urinary symptoms likely multifactorial including underlying Parkinson's disease. Overall bother is minimal. He is emptying well today and is not currently on any other medications. Today his urinalysis is negative and we'll continue his conservative management.  Return in about 9 months (around 08/23/2023) for November to see [facility] with labs prior: testoserone, CBC, PSA.  I have reviewed the above documentation for accuracy and completeness, and I agree with the above.   Hollice Espy, MD   [facility] [address], [place], [place] [address] [phone]

## 2023-01-18 ENCOUNTER — Encounter: Payer: Self-pay | Admitting: Urology

## 2023-01-18 DIAGNOSIS — E291 Testicular hypofunction: Secondary | ICD-10-CM

## 2023-01-19 MED ORDER — TESTOSTERONE 20.25 MG/1.25GM (1.62%) TD GEL: 2.0000 | Freq: Every day | TRANSDERMAL | 2 refills | Status: DC

## 2023-01-19 NOTE — Telephone Encounter (Signed)
Patient advised RX sent in to [facility]. Lab appointment set up. If Androgel does not get covered by insurance patient will continue with the compounding cream he has been using.

## 2023-01-19 NOTE — Telephone Encounter (Signed)
Spoke with patient to verify. Patient states when he was last seen he thought Dr Apolinar Junes told him that we used a compound pharmacy in [place] we can switch patient to instead of the [facility] that Dr Artis Flock was using and continue the compound cream he was using and trying to run it through insurance. I advised patient I would check with Dr Apolinar Junes to make sure we are on the same page since OV notes I read were different. He is open to doing whichever we think is best for him. He is ok with trying to use Androgel and having it send to [facility] to see if it will be covered. Advised patient I would call him back.

## 2023-01-19 NOTE — Telephone Encounter (Signed)
I pulled down Androgel medication but did not fill in how many pumps patient is to use. Current cream directions are to apply 1 cc. I will speak with patient once medication is sent in. Re check lab work in 3 months still on this medication?

## 2023-01-19 NOTE — Addendum Note (Signed)
Addended by: Consuella Lose on: 01/19/2023 03:10 PM   Modules accepted: Orders

## 2023-01-20 ENCOUNTER — Telehealth: Payer: Self-pay

## 2023-01-20 NOTE — Telephone Encounter (Signed)
PA approved thru 01/20/24, patient advised and pharmacy advised via fax

## 2023-01-20 NOTE — Telephone Encounter (Signed)
PA submitted for PA Testosterone 1.62%-Androgel on covermymeds. Waiting for decision. Key: [id number]

## 2023-02-20 ENCOUNTER — Other Ambulatory Visit: Payer: Self-pay | Admitting: Physical Medicine and Rehabilitation

## 2023-02-20 DIAGNOSIS — M5416 Radiculopathy, lumbar region: Secondary | ICD-10-CM

## 2023-02-22 ENCOUNTER — Ambulatory Visit
Admission: RE | Admit: 2023-02-22 | Discharge: 2023-02-22 | Disposition: A | Discharge status: Home / Self Care | Payer: Medicare Other | Source: Ambulatory Visit | Attending: Physical Medicine and Rehabilitation | Admitting: Physical Medicine and Rehabilitation

## 2023-02-22 DIAGNOSIS — M5416 Radiculopathy, lumbar region: Secondary | ICD-10-CM

## 2023-02-28 ENCOUNTER — Other Ambulatory Visit: Payer: Self-pay | Admitting: Physical Medicine and Rehabilitation

## 2023-02-28 DIAGNOSIS — M25552 Pain in left hip: Secondary | ICD-10-CM

## 2023-03-01 ENCOUNTER — Ambulatory Visit
Admission: RE | Admit: 2023-03-01 | Discharge: 2023-03-01 | Disposition: A | Discharge status: Home / Self Care | Payer: Medicare Other | Source: Ambulatory Visit | Attending: Physical Medicine and Rehabilitation | Admitting: Physical Medicine and Rehabilitation

## 2023-03-01 DIAGNOSIS — M25552 Pain in left hip: Secondary | ICD-10-CM

## 2023-03-13 ENCOUNTER — Other Ambulatory Visit: Payer: BLUE CROSS/BLUE SHIELD

## 2023-04-17 ENCOUNTER — Other Ambulatory Visit: Payer: Medicare Other

## 2023-04-17 DIAGNOSIS — E291 Testicular hypofunction: Secondary | ICD-10-CM

## 2023-04-18 LAB — TESTOSTERONE: Testosterone: 333 ng/dL (ref 264–916)

## 2023-04-18 LAB — HEMOGLOBIN AND HEMATOCRIT, BLOOD
Hematocrit: 43.9 % (ref 37.5–51.0)
Hemoglobin: 14.7 g/dL (ref 13.0–17.7)

## 2023-04-19 ENCOUNTER — Other Ambulatory Visit: Payer: Self-pay | Admitting: Urology

## 2023-04-19 NOTE — Telephone Encounter (Signed)
Patient returned call to [facility], and he stated that he would like to increase to 3 pumps testosterone (he is currently doing 2).

## 2023-04-20 MED ORDER — TESTOSTERONE 20.25 MG/1.25GM (1.62%) TD GEL: 3.0000 | Freq: Every day | TRANSDERMAL | 2 refills | Status: DC

## 2023-04-20 NOTE — Telephone Encounter (Signed)
This is in response to the lab result.

## 2023-04-20 NOTE — Addendum Note (Signed)
Addended by: Vanna Scotland on: 04/20/2023 09:20 AM   Modules accepted: Orders

## 2023-04-20 NOTE — Telephone Encounter (Signed)
Left detailed message letting patient know that medication was refilled to [facility] pharmacy.

## 2023-04-20 NOTE — Addendum Note (Signed)
Addended by: Consuella Lose on: 04/20/2023 08:27 AM   Modules accepted: Orders

## 2023-04-21 ENCOUNTER — Telehealth: Payer: Self-pay

## 2023-04-21 NOTE — Telephone Encounter (Signed)
PA for Testosterone 1.62% 3 pumps daily started on covermymeds. Waiting for reply

## 2023-04-24 NOTE — Telephone Encounter (Signed)
PA approved thru 04/20/24, pharmacy advised

## 2023-04-26 ENCOUNTER — Other Ambulatory Visit: Payer: Medicare Other

## 2023-08-16 ENCOUNTER — Other Ambulatory Visit: Payer: Medicare Other

## 2023-08-16 NOTE — Progress Notes (Signed)
08/22/2023 9:25 AM   Dale [name] 1951-06-05 [id number]  Referring provider: Mick Sell, MD [address] [place],  [place] [address]  Urological history: 1. BPH with LU TS -PSA (08/2023) 2.2  -UroLift (2022)  2. Hypogonadism -contributing factors of age -Testosterone level (08/2023) 533 -Hemoglobin/hematocrit (07/2023) 15.0/43.5  3. ED -Contributing factors of COVID-19, hyperlipidemia, chronic low back pain, BPH and alcohol consumption -Trimix   Chief Complaint  Patient presents with   Elevated PSA   HPI: Dale [name] is a 72 y.o. male who presents today for follow up.   Previous records reviewed.   He is only urinary complaint is urinary frequency when he drinks a lot of water.   Patient denies any modifying or aggravating factors.  Patient denies any recent UTI's, gross hematuria, dysuria or suprapubic/flank pain.  Patient denies any fevers, chills, nausea or vomiting.    He continues to have spontaneous erections.  He does not have pain with erections, he does not have curvature with erections, he does use Trimix on occasion for sexual intercourse.  He would like to go back to his compounded testosterone cream as he is finding the prescription generic AndroGel is cost prohibitive.  He will call back with the compounding pharmacy information.  He also will need a refill on the Trimix, but he will need to research his notes because he purchased it directly from Dr. Terrance Mass office and we will need the potency and dose.  PMH: Past Medical History:  Diagnosis Date   Anginal pain (HCC)    Basal cell carcinoma of skin of other parts of face 01/24/2013   COVID-19    GERD (gastroesophageal reflux disease)    Multilevel degenerative disc disease    L2,3,4 and Cervical   Mycoplasma pneumonia    patient highly susceptible to URI's   Parkinson's disease (HCC)    mild    Surgical History: Past Surgical History:  Procedure Laterality Date    COLONOSCOPY WITH PROPOFOL N/A 08/16/2018   Procedure: COLONOSCOPY WITH BIOPSY;  Surgeon: Midge Minium, MD;  Location: [facility];  Service: Endoscopy;  Laterality: N/A;   CYSTOSCOPY WITH INSERTION OF UROLIFT N/A 04/29/2021   Procedure: CYSTOSCOPY WITH INSERTION OF UROLIFT;  Surgeon: Orson Ape, MD;  Location: [facility];  Service: Urology;  Laterality: N/A;   KNEE ARTHROSCOPY Left 06/12/2019   Procedure: ARTHROSCOPY KNEE partial medial lateral menisectomy;  Surgeon: Donato Heinz, MD;  Location: [facility];  Service: Orthopedics;  Laterality: Left;   POLYPECTOMY N/A 08/16/2018   Procedure: POLYPECTOMY;  Surgeon: Midge Minium, MD;  Location: [facility];  Service: Endoscopy;  Laterality: N/A;   SHOULDER ARTHROSCOPY Bilateral    TONSILLECTOMY      Home Medications:  Allergies as of 08/22/2023       Reactions   Other Other (See Comments)        Medication List        Accurate as of August 22, 2023  9:25 AM. If you have any questions, ask your nurse or doctor.          STOP taking these medications    triamcinolone 55 MCG/ACT Aero nasal inhaler Commonly known as: NASACORT Stopped by: Michiel Cowboy       TAKE these medications    aspirin EC 81 MG tablet Take 81 mg by mouth daily. Swallow whole.   carbidopa-levodopa 25-100 MG disintegrating tablet Commonly known as: PARCOPA Take 1 tablet by mouth 4 (four) times daily.  omeprazole 40 MG capsule Commonly known as: PRILOSEC Take 40 mg by mouth every morning.   Testosterone 20 % Crea Apply 1 mL topically in the morning and at bedtime. To back of knee   Testosterone 20.25 MG/1.25GM (1.62%) Gel Commonly known as: AndroGel Apply 3 Pump topically daily.        Allergies:  Allergies  Allergen Reactions   Other Other (See Comments)    Family History: History reviewed. No pertinent family history.  Social History:  reports that he has never smoked. He has never used smokeless tobacco.  He reports current alcohol use of about 6.0 standard drinks of alcohol per week. He reports that he does not use drugs.  ROS: Pertinent ROS in HPI  Physical Exam: BP 132/81   Pulse 71   Ht 5\' 10"  (1.778 m)   Wt 172 lb (78 kg)   BMI 24.68 kg/m   Constitutional:  Well nourished. Alert and oriented, No acute distress. HEENT: [place] AT, moist mucus membranes.  Trachea midline Cardiovascular: No clubbing, cyanosis, or edema. GU: No CVA tenderness.  No bladder fullness or masses.  Patient with circumcised phallus.  Urethral meatus is patent.  No penile discharge. No penile lesions or rashes. Scrotum without lesions, cysts, rashes and/or edema.  Testicles are located scrotally bilaterally.  They are atrophic. No masses are appreciated in the testicles. Left and right epididymis are normal. Rectal: Patient with  normal sphincter tone. Anus and perineum without scarring or rashes. No rectal masses are appreciated. Prostate is approximately 60 + grams, could not palpate the entire gland, no nodules are appreciated. Seminal vesicles could not  be palpated. Neurologic: Grossly intact, no focal deficits, moving all 4 extremities. Psychiatric: Normal mood and affect.  Laboratory Data: CBC w/auto Differential (5 Part) Order: [id number] Component Ref Range & Units 2 wk ago  WBC (White Blood Cell Count) 4.1 - 10.2 10^3/uL 5.8  RBC (Red Blood Cell Count) 4.69 - 6.13 10^6/uL 4.74  Hemoglobin 14.1 - 18.1 gm/dL 15  Hematocrit 40.9 - 81.1 % 43.5  MCV (Mean Corpuscular Volume) 80.0 - 100.0 fl 91.8  MCH (Mean Corpuscular Hemoglobin) 27.0 - 31.2 pg 31.6 High   MCHC (Mean Corpuscular Hemoglobin Concentration) 32.0 - 36.0 gm/dL 91.4  Platelet Count 782 - 450 10^3/uL 264  RDW-CV (Red Cell Distribution Width) 11.6 - 14.8 % 12.7  MPV (Mean Platelet Volume) 9.4 - 12.4 fl 9.6  Neutrophils 1.50 - 7.80 10^3/uL 2.59  Lymphocytes 1.00 - 3.60 10^3/uL 2.22  Monocytes 0.00 - 1.50 10^3/uL 0.55   Eosinophils 0.00 - 0.55 10^3/uL 0.36  Basophils 0.00 - 0.09 10^3/uL 0.02  Neutrophil % 32.0 - 70.0 % 45  Lymphocyte % 10.0 - 50.0 % 38.6  Monocyte % 4.0 - 13.0 % 9.6  Eosinophil % 1.0 - 5.0 % 6.3 High   Basophil% 0.0 - 2.0 % 0.3  Immature Granulocyte % <=0.7 % 0.2  Immature Granulocyte Count <=0.06 10^3/L 0.01  Resulting Agency [facility]   Specimen Collected: 08/01/23 08:22   Performed by: [facility] Last Resulted: 08/01/23 09:16  Received From: [facility]  Result Received: 08/16/23 15:39    Comprehensive Metabolic Panel (CMP) Order: [id number] Component Ref Range & Units 2 wk ago  Glucose 70 - 110 mg/dL 578  Sodium 469 - 629 mmol/L 134 Low   Potassium 3.6 - 5.1 mmol/L 4.5  Chloride 97 - 109 mmol/L 96 Low   Carbon Dioxide (CO2) 22.0 - 32.0 mmol/L  29.4  Urea Nitrogen (BUN) 7 - 25 mg/dL 16  Creatinine 0.7 - 1.3 mg/dL 1  Glomerular Filtration Rate (eGFR) >60 mL/min/1.73sq m 79  Comment: CKD-EPI (2021) does not include patient's race in the calculation of eGFR.  Monitoring changes of plasma creatinine and eGFR over time is useful for monitoring kidney function.  Interpretive Ranges for eGFR (CKD-EPI 2021):  eGFR:       >60 mL/min/1.73 sq. m - Normal eGFR:       30-59 mL/min/1.73 sq. m - Moderately Decreased eGFR:       15-29 mL/min/1.73 sq. m  - Severely Decreased eGFR:       < 15 mL/min/1.73 sq. m  - Kidney Failure   Note: These eGFR calculations do not apply in acute situations when eGFR is changing rapidly or patients on dialysis.  Calcium 8.7 - 10.3 mg/dL 9.3  AST 8 - 39 U/L 18  ALT 6 - 57 U/L 8  Alk Phos (alkaline Phosphatase) 34 - 104 U/L 86  Albumin 3.5 - 4.8 g/dL 4.5  Bilirubin, Total 0.3 - 1.2 mg/dL 0.6  Protein, Total 6.1 - 7.9 g/dL 6.6  A/G Ratio 1.0 - 5.0 gm/dL 2.1  Resulting Agency [facility]   Specimen Collected: 08/01/23 08:22   Performed by: [facility] Last Resulted: 08/01/23 12:31  Received From: [facility]  Result Received: 08/16/23 15:39    Lipid Panel w/calc LDL Order: [id number] Component Ref Range & Units 2 wk ago  Cholesterol, Total 100 - 200 mg/dL 272  Triglyceride 35 - 199 mg/dL 58  HDL (High Density Lipoprotein) Cholesterol 29.0 - 71.0 mg/dL 53.6  LDL Calculated 0 - 130 mg/dL 80  VLDL Cholesterol mg/dL 12  Cholesterol/HDL Ratio 2.4  Resulting Agency [facility]   Specimen Collected: 08/01/23 08:22   Performed by: [facility] Last Resulted: 08/01/23 12:44  Received From: [facility]  Result Received: 08/16/23 15:39   Thyroid Stimulating-Hormone (TSH) Order: [id number] Component Ref Range & Units 2 wk ago  Thyroid Stimulating Hormone (TSH) 0.450-5.330 uIU/ml uIU/mL 2.977  Resulting Agency [facility]   Specimen Collected: 08/01/23 08:22   Performed by: [facility] Last Resulted: 08/01/23 12:14  Received From: [facility]  Result Received: 08/16/23 15:39  I have reviewed the labs.   Pertinent Imaging: N/A  Assessment & Plan:    1. Hypogonadism  -testosterone levels are therapeutic -H & H normal -Will change to compounding testosterone cream, he will call back with the information he will call back with the information so we can send in a prescription  2. BPH with LUTS -PSA stable -DRE benign -s/p UroLift  -continue conservative management, avoiding bladder irritants and timed voiding's  3. Erectile dysfunction:    -He would like to continue the Trimix, he will call back with the information so that we can call in a prescription    Return in about 6 months (around 02/19/2024) for PSA, testosterone, H & H - only.  Then 1 year from now for testosterone, PSA, hemoglobin, hematocrit, IPSS, SH IM and exam  These notes generated with voice recognition software. I apologize for typographical  errors.  Cloretta Ned  [facility] [address]  [address] [place], [place] [address] [phone]

## 2023-08-21 ENCOUNTER — Other Ambulatory Visit: Payer: Self-pay | Admitting: Urology

## 2023-08-21 ENCOUNTER — Other Ambulatory Visit: Payer: Medicare Other

## 2023-08-21 DIAGNOSIS — N400 Enlarged prostate without lower urinary tract symptoms: Secondary | ICD-10-CM

## 2023-08-21 DIAGNOSIS — N529 Male erectile dysfunction, unspecified: Secondary | ICD-10-CM

## 2023-08-21 DIAGNOSIS — E291 Testicular hypofunction: Secondary | ICD-10-CM

## 2023-08-22 ENCOUNTER — Ambulatory Visit: Payer: Medicare Other | Admitting: Urology

## 2023-08-22 ENCOUNTER — Encounter: Payer: Self-pay | Admitting: Urology

## 2023-08-22 VITALS — BP 132/81 | HR 71 | Ht 70.0 in | Wt 172.0 lb

## 2023-08-22 DIAGNOSIS — N529 Male erectile dysfunction, unspecified: Secondary | ICD-10-CM | POA: Diagnosis not present

## 2023-08-22 DIAGNOSIS — N401 Enlarged prostate with lower urinary tract symptoms: Secondary | ICD-10-CM

## 2023-08-22 DIAGNOSIS — E291 Testicular hypofunction: Secondary | ICD-10-CM | POA: Diagnosis not present

## 2023-08-22 DIAGNOSIS — N400 Enlarged prostate without lower urinary tract symptoms: Secondary | ICD-10-CM

## 2023-08-22 LAB — CBC WITH DIFFERENTIAL/PLATELET
Basophils Absolute: 0 10*3/uL (ref 0.0–0.2)
Basos: 1 %
EOS (ABSOLUTE): 0.2 10*3/uL (ref 0.0–0.4)
Eos: 3 %
Hematocrit: 41.7 % (ref 37.5–51.0)
Hemoglobin: 13.9 g/dL (ref 13.0–17.7)
Immature Grans (Abs): 0 10*3/uL (ref 0.0–0.1)
Immature Granulocytes: 0 %
Lymphocytes Absolute: 2.2 10*3/uL (ref 0.7–3.1)
Lymphs: 32 %
MCH: 30.8 pg (ref 26.6–33.0)
MCHC: 33.3 g/dL (ref 31.5–35.7)
MCV: 93 fL (ref 79–97)
Monocytes Absolute: 0.6 10*3/uL (ref 0.1–0.9)
Monocytes: 8 %
Neutrophils Absolute: 3.8 10*3/uL (ref 1.4–7.0)
Neutrophils: 56 %
Platelets: 325 10*3/uL (ref 150–450)
RBC: 4.51 x10E6/uL (ref 4.14–5.80)
RDW: 12.5 % (ref 11.6–15.4)
WBC: 6.8 10*3/uL (ref 3.4–10.8)

## 2023-08-22 LAB — PSA: Prostate Specific Ag, Serum: 2.2 ng/mL (ref 0.0–4.0)

## 2023-08-22 LAB — TESTOSTERONE: Testosterone: 533 ng/dL (ref 264–916)

## 2023-08-31 ENCOUNTER — Telehealth: Payer: Self-pay | Admitting: Urology

## 2023-08-31 NOTE — Telephone Encounter (Signed)
I gave a verbal prescription to the [facility] med solution compounding company for the T cream at 20%, 200 mg/milliliters 2 mL applied once daily and also for the Trimix (30/1/10) inject 4 units as needed for intercourse no more than every other day

## 2023-12-29 ENCOUNTER — Ambulatory Visit
Admission: RE | Admit: 2023-12-29 | Discharge: 2023-12-29 | Disposition: A | Discharge status: Home / Self Care | Source: Ambulatory Visit | Attending: Family Medicine | Admitting: Family Medicine

## 2023-12-29 ENCOUNTER — Other Ambulatory Visit: Payer: Self-pay | Admitting: Family Medicine

## 2023-12-29 DIAGNOSIS — S12390D Other displaced fracture of fourth cervical vertebra, subsequent encounter for fracture with routine healing: Secondary | ICD-10-CM | POA: Diagnosis present

## 2024-01-30 ENCOUNTER — Other Ambulatory Visit: Payer: Self-pay

## 2024-01-30 DIAGNOSIS — Z860100 Personal history of colon polyps, unspecified: Secondary | ICD-10-CM

## 2024-01-30 MED ORDER — NA SULFATE-K SULFATE-MG SULF 17.5-3.13-1.6 GM/177ML PO SOLN
1.0000 | Freq: Once | ORAL | 0 refills | Status: AC
Start: 2024-01-30 — End: 2024-01-30

## 2024-01-30 NOTE — Addendum Note (Signed)
 Addended by: Lovie Rudder on: 01/30/2024 12:51 PM   Modules accepted: Orders

## 2024-02-01 ENCOUNTER — Other Ambulatory Visit: Payer: Self-pay | Admitting: Urology

## 2024-02-01 MED ORDER — TADALAFIL 10 MG PO TABS: 10.0000 mg | ORAL_TABLET | Freq: Every day | ORAL | 0 refills | Status: AC | PRN

## 2024-02-19 ENCOUNTER — Encounter (HOSPITAL_COMMUNITY): Payer: Self-pay

## 2024-02-19 ENCOUNTER — Other Ambulatory Visit: Payer: Medicare Other

## 2024-02-21 ENCOUNTER — Other Ambulatory Visit: Payer: Self-pay

## 2024-02-21 DIAGNOSIS — N400 Enlarged prostate without lower urinary tract symptoms: Secondary | ICD-10-CM

## 2024-02-21 DIAGNOSIS — E291 Testicular hypofunction: Secondary | ICD-10-CM

## 2024-02-21 DIAGNOSIS — N529 Male erectile dysfunction, unspecified: Secondary | ICD-10-CM

## 2024-02-22 LAB — PSA: Prostate Specific Ag, Serum: 2.7 ng/mL (ref 0.0–4.0)

## 2024-02-22 LAB — HEMOGLOBIN AND HEMATOCRIT, BLOOD
Hematocrit: 47.1 % (ref 37.5–51.0)
Hemoglobin: 15.5 g/dL (ref 13.0–17.7)

## 2024-02-22 LAB — TESTOSTERONE: Testosterone: 773 ng/dL (ref 264–916)

## 2024-02-23 ENCOUNTER — Encounter: Payer: Self-pay | Admitting: Gastroenterology

## 2024-02-26 ENCOUNTER — Ambulatory Visit: Admitting: Registered Nurse

## 2024-02-26 ENCOUNTER — Ambulatory Visit
Admission: RE | Admit: 2024-02-26 | Discharge: 2024-02-26 | Disposition: A | Discharge status: Home / Self Care | Attending: Gastroenterology | Admitting: Gastroenterology

## 2024-02-26 ENCOUNTER — Encounter: Payer: Self-pay | Admitting: Gastroenterology

## 2024-02-26 ENCOUNTER — Other Ambulatory Visit: Payer: Self-pay

## 2024-02-26 ENCOUNTER — Encounter
Admission: RE | Disposition: A | Discharge status: Home / Self Care | Payer: Self-pay | Source: Home / Self Care | Attending: Gastroenterology

## 2024-02-26 DIAGNOSIS — K640 First degree hemorrhoids: Secondary | ICD-10-CM | POA: Insufficient documentation

## 2024-02-26 DIAGNOSIS — K219 Gastro-esophageal reflux disease without esophagitis: Secondary | ICD-10-CM | POA: Diagnosis not present

## 2024-02-26 DIAGNOSIS — Z1211 Encounter for screening for malignant neoplasm of colon: Secondary | ICD-10-CM | POA: Insufficient documentation

## 2024-02-26 DIAGNOSIS — Z860100 Personal history of colon polyps, unspecified: Secondary | ICD-10-CM

## 2024-02-26 HISTORY — PX: COLONOSCOPY: SHX5424

## 2024-02-26 SURGERY — COLONOSCOPY
Anesthesia: General

## 2024-02-26 MED ORDER — PROPOFOL 10 MG/ML IV BOLUS
INTRAVENOUS | Status: DC | PRN
  Administered 2024-02-26: 50 mg via INTRAVENOUS

## 2024-02-26 MED ORDER — LIDOCAINE HCL (PF) 2 % IJ SOLN
INTRAMUSCULAR | Status: DC | PRN
  Administered 2024-02-26: 60 mg via INTRADERMAL

## 2024-02-26 MED ORDER — SODIUM CHLORIDE 0.9 % IV SOLN: INTRAVENOUS | Status: DC

## 2024-02-26 MED ORDER — PROPOFOL 10 MG/ML IV BOLUS
INTRAVENOUS | Status: AC
  Filled 2024-02-26: qty 20

## 2024-02-26 MED ORDER — PROPOFOL 500 MG/50ML IV EMUL
INTRAVENOUS | Status: DC | PRN
  Administered 2024-02-26: 100 ug/kg/min via INTRAVENOUS

## 2024-02-26 MED ORDER — PROPOFOL 1000 MG/100ML IV EMUL
INTRAVENOUS | Status: AC
  Filled 2024-02-26: qty 100

## 2024-02-26 NOTE — Anesthesia Preprocedure Evaluation (Signed)
 Anesthesia Evaluation  Patient identified by MRN, date of birth, ID band Patient awake    Reviewed: Allergy & Precautions, H&P , NPO status , Patient's Chart, lab work & pertinent test results, reviewed documented beta blocker date and time   Airway Mallampati: II   Neck ROM: full    Dental  (+) Poor Dentition   Pulmonary pneumonia, resolved   Pulmonary exam normal        Cardiovascular Exercise Tolerance: Good negative cardio ROS Normal cardiovascular exam Rhythm:regular Rate:Normal     Neuro/Psych negative neurological ROS  negative psych ROS   GI/Hepatic Neg liver ROS,GERD  Medicated,,  Endo/Other  negative endocrine ROS    Renal/GU negative Renal ROS  negative genitourinary   Musculoskeletal   Abdominal   Peds  Hematology negative hematology ROS (+)   Anesthesia Other Findings Past Medical History: No date: Anginal pain (HCC) 01/24/2013: Basal cell carcinoma of skin of other parts of face No date: COVID-19 No date: GERD (gastroesophageal reflux disease) No date: Multilevel degenerative disc disease     Comment:  L2,3,4 and Cervical No date: Mycoplasma pneumonia     Comment:  patient highly susceptible to URI's No date: Parkinson's disease (HCC)     Comment:  mild Past Surgical History: 08/16/2018: COLONOSCOPY WITH PROPOFOL ; N/A     Comment:  Procedure: COLONOSCOPY WITH BIOPSY;  Surgeon: Marnee Sink, MD;  Location: [facility];  Service:               Endoscopy;  Laterality: N/A; 04/29/2021: CYSTOSCOPY WITH INSERTION OF UROLIFT; N/A     Comment:  Procedure: CYSTOSCOPY WITH INSERTION OF UROLIFT;                Surgeon: Rea Cambridge, MD;  Location: [facility] ORS;                Service: Urology;  Laterality: N/A; 06/12/2019: KNEE ARTHROSCOPY; Left     Comment:  Procedure: ARTHROSCOPY KNEE partial medial lateral               menisectomy;  Surgeon: Arlyne Lame, MD;  Location:                [facility] ORS;  Service: Orthopedics;  Laterality: Left; 08/16/2018: POLYPECTOMY; N/A     Comment:  Procedure: POLYPECTOMY;  Surgeon: Marnee Sink, MD;                Location: [facility];  Service: Endoscopy;                Laterality: N/A; No date: SHOULDER ARTHROSCOPY; Bilateral No date: TONSILLECTOMY BMI    Body Mass Index: 24.97 kg/m     Reproductive/Obstetrics negative OB ROS                             Anesthesia Physical Anesthesia Plan  ASA: 3  Anesthesia Plan: General   Post-op Pain Management:    Induction:   PONV Risk Score and Plan:   Airway Management Planned:   Additional Equipment:   Intra-op Plan:   Post-operative Plan:   Informed Consent: I have reviewed the patients History and Physical, chart, labs and discussed the procedure including the risks, benefits and alternatives for the proposed anesthesia with the patient or authorized representative who has indicated his/her understanding and acceptance.     Dental Advisory  Given  Plan Discussed with: CRNA  Anesthesia Plan Comments:        Anesthesia Quick Evaluation

## 2024-02-26 NOTE — Transfer of Care (Signed)
 Immediate Anesthesia Transfer of Care Note  Patient: Dale [name]  Procedure(s) Performed: COLONOSCOPY  Patient Location: PACU  Anesthesia Type:General  Level of Consciousness: drowsy and patient cooperative  Airway & Oxygen Therapy: Patient Spontanous Breathing  Post-op Assessment: Report given to RN and Post -op Vital signs reviewed and stable  Post vital signs: stable  Last Vitals:  Vitals Value Taken Time  BP 87/63 02/26/24 0807  Temp 35.9 C 02/26/24 0806  Pulse 67 02/26/24 0808  Resp 11 02/26/24 0808  SpO2 97 % 02/26/24 0808  Vitals shown include unfiled device data.  Last Pain:  Vitals:   02/26/24 0806  TempSrc: Temporal  PainSc: 0-No pain         Complications: No notable events documented.

## 2024-02-26 NOTE — Op Note (Signed)
 [facility] Gastroenterology Patient Name: Dale [name] Procedure Date: 02/26/2024 7:21 AM MRN: [id number] Account #: [id number] Date of Birth: Feb 12, 1951 Admit Type: Outpatient Age: 73 Room: [facility] ENDO ROOM 4 Gender: Male Note Status: Finalized Instrument Name: Charlyn Cooley [id number] Procedure:             Colonoscopy Indications:           High risk colon cancer surveillance: Personal history                         of colonic polyps Providers:             Marnee Sink MD, MD Referring MD:          Emi Hanson (Referring MD) Medicines:             Propofol  per Anesthesia Complications:         No immediate complications. Procedure:             Pre-Anesthesia Assessment:                        - Prior to the procedure, a History and Physical was                         performed, and patient medications and allergies were                         reviewed. The patient's tolerance of previous                         anesthesia was also reviewed. The risks and benefits                         of the procedure and the sedation options and risks                         were discussed with the patient. All questions were                         answered, and informed consent was obtained. Prior                         Anticoagulants: The patient has taken no anticoagulant                         or antiplatelet agents. ASA Grade Assessment: II - A                         patient with mild systemic disease. After reviewing                         the risks and benefits, the patient was deemed in                         satisfactory condition to undergo the procedure.                        After obtaining informed consent, the colonoscope was  passed under direct vision. Throughout the procedure,                         the patient's blood pressure, pulse, and oxygen                         saturations were monitored continuously. The                          Colonoscope was introduced through the anus and                         advanced to the the cecum, identified by appendiceal                         orifice and ileocecal valve. The colonoscopy was                         performed without difficulty. The patient tolerated                         the procedure well. The quality of the bowel                         preparation was excellent. Findings:      The perianal and digital rectal examinations were normal.      Non-bleeding internal hemorrhoids were found during retroflexion. The       hemorrhoids were Grade I (internal hemorrhoids that do not prolapse). Impression:            - Non-bleeding internal hemorrhoids.                        - No specimens collected. Recommendation:        - Discharge patient to home.                        - Resume previous diet.                        - Continue present medications.                        - Repeat colonoscopy is not recommended for                         surveillance. Procedure Code(s):     --- Professional ---                        [phone], Colonoscopy, flexible; diagnostic, including                         collection of specimen(s) by brushing or washing, when                         performed (separate procedure) Diagnosis Code(s):     --- Professional ---                        Z86.010, Personal history of colonic polyps CPT copyright 2022 American Medical Association. All  rights reserved. The codes documented in this report are preliminary and upon coder review may  be revised to meet current compliance requirements. Marnee Sink MD, MD 02/26/2024 8:03:51 AM This report has been signed electronically. Number of Addenda: 0 Note Initiated On: 02/26/2024 7:21 AM Scope Withdrawal Time: 0 hours 7 minutes 31 seconds  Total Procedure Duration: 0 hours 12 minutes 6 seconds  Estimated Blood Loss:  Estimated blood loss: none.      [facility]

## 2024-02-26 NOTE — Anesthesia Postprocedure Evaluation (Signed)
 Anesthesia Post Note  Patient: Dale [name]  Procedure(s) Performed: COLONOSCOPY  Patient location during evaluation: PACU Anesthesia Type: General Level of consciousness: awake and alert Pain management: pain level controlled Vital Signs Assessment: post-procedure vital signs reviewed and stable Respiratory status: spontaneous breathing, nonlabored ventilation, respiratory function stable and patient connected to nasal cannula oxygen Cardiovascular status: blood pressure returned to baseline and stable Postop Assessment: no apparent nausea or vomiting Anesthetic complications: no   No notable events documented.   Last Vitals:  Vitals:   02/26/24 0816 02/26/24 0826  BP: 99/68 115/75  Pulse: 66 (!) 59  Resp: 14 14  Temp:    SpO2: 99% 100%    Last Pain:  Vitals:   02/26/24 0826  TempSrc:   PainSc: 0-No pain                 Zula Hitch

## 2024-02-26 NOTE — H&P (Signed)
 Dale Sink, MD [facility] [address] [place], [place] [address] Phone:[phone] Fax : [phone]  Primary Care Physician:  Dale Gold, MD Primary Gastroenterologist:  Dr. Ole [name]  Pre-Procedure History & Physical: HPI:  Dale [name] is [name] 73 y.o. male is here for an colonoscopy.   Past Medical History:  Diagnosis Date   Anginal pain (HCC)    Basal cell carcinoma of skin of other parts of face 01/24/2013   COVID-19    GERD (gastroesophageal reflux disease)    Multilevel degenerative disc disease    L2,3,4 and Cervical   Mycoplasma pneumonia    patient highly susceptible to URI's   Parkinson's disease (HCC)    mild    Past Surgical History:  Procedure Laterality Date   COLONOSCOPY WITH PROPOFOL  N/[name] 08/16/2018   Procedure: COLONOSCOPY WITH BIOPSY;  Surgeon: Dale Sink, MD;  Location: [facility];  Service: Endoscopy;  Laterality: N/[name];   CYSTOSCOPY WITH INSERTION OF UROLIFT N/[name] 04/29/2021   Procedure: CYSTOSCOPY WITH INSERTION OF UROLIFT;  Surgeon: Dale Cambridge, MD;  Location: [facility] ORS;  Service: Urology;  Laterality: N/[name];   KNEE ARTHROSCOPY Left 06/12/2019   Procedure: ARTHROSCOPY KNEE partial medial lateral menisectomy;  Surgeon: Dale Lame, MD;  Location: [facility] ORS;  Service: Orthopedics;  Laterality: Left;   POLYPECTOMY N/[name] 08/16/2018   Procedure: POLYPECTOMY;  Surgeon: Dale Sink, MD;  Location: [facility];  Service: Endoscopy;  Laterality: N/[name];   SHOULDER ARTHROSCOPY Bilateral    TONSILLECTOMY      Prior to Admission medications   Medication Sig Start Date End Date Taking? Authorizing Provider  aspirin  EC 81 MG tablet Take 81 mg by mouth daily. Swallow whole.   Yes [provider]  carbidopa-levodopa (SINEMET IR) 25-100 MG tablet Take 1 tablet by mouth 4 (four) times daily. 12/15/23  Yes [provider]  CRESTOR 5 MG tablet Take 5 mg by mouth daily. 02/28/23  Yes [provider]  omeprazole  (PRILOSEC) 20 MG capsule Take 20 mg by mouth every morning. 10/23/23  Yes [provider]  tadalafil  (CIALIS ) 10 MG tablet Take 1 tablet (10 mg total) by mouth daily as needed for erectile dysfunction. 02/01/24   Dale Son A, PA-C  Testosterone  20 % CREA Apply 1 mL topically in the morning and at bedtime. To back of knee    [provider]    Allergies as of 01/30/2024 - Review Complete 08/22/2023  Allergen Reaction Noted   Other Other (See Comments) 07/31/2020    History reviewed. No pertinent family history.  Social History   Socioeconomic History   Marital status: Married    Spouse name: Dale [name]   Number of children: Not on file   Years of education: Not on file   Highest education level: Not on file  Occupational History   Not on file  Tobacco Use   Smoking status: Never   Smokeless tobacco: Never  Vaping Use   Vaping status: Never Used  Substance and Sexual Activity   Alcohol use: Yes    Alcohol/week: 6.0 standard drinks of alcohol    Types: 6 Glasses of wine per week    Comment:     Drug use: No   Sexual activity: Not on file  Other Topics Concern   Not on file  Social History Narrative   Not on file   Social Drivers of Health   Financial Resource Strain: Low Risk  (12/29/2023)   Received from [facility]   Overall Financial Resource Strain (CARDIA)    Difficulty of Paying Living Expenses: Not hard at all  Food Insecurity: No Food Insecurity (12/29/2023)   Received from [facility]   Hunger Vital Sign    Worried About Running Out of Food in the Last Year: Never true    Ran Out of Food in the Last Year: Never true  Transportation Needs: No Transportation Needs (12/29/2023)   Received from [facility] - Transportation    In the past 12 months, has lack of transportation kept you from medical appointments or from getting medications?: No    Lack of Transportation (Non-Medical): No   Physical Activity: Not on file  Stress: Not on file  Social Connections: Not on file  Intimate Partner Violence: Not on file    Review of Systems: See HPI, otherwise negative ROS  Physical Exam: BP 133/77   Pulse 60   Temp (!) 96.1 F (35.6 C) (Temporal)   Resp 16   Ht 5\' 10"  (1.778 m)   Wt 78.9 kg   SpO2 100%   BMI 24.97 kg/m  General:   Alert,  pleasant and cooperative in NAD Head:  Normocephalic and atraumatic. Neck:  Supple; no masses or thyromegaly. Lungs:  Clear throughout to auscultation.    Heart:  Regular rate and rhythm. Abdomen:  Soft, nontender and nondistended. Normal bowel sounds, without guarding, and without rebound.   Neurologic:  Alert and  oriented x4;  grossly normal neurologically.  Impression/Plan: Dale [name] is here for an colonoscopy to be performed for [name] history of adenomatous polyps on 2019   Risks, benefits, limitations, and alternatives regarding  colonoscopy have been reviewed with the patient.  Questions have been answered.  All parties agreeable.   Dale Sink, MD  02/26/2024, 7:35 AM

## 2024-02-27 ENCOUNTER — Encounter: Payer: Self-pay | Admitting: Gastroenterology

## 2024-02-27 ENCOUNTER — Ambulatory Visit: Payer: Medicare Other | Admitting: Urology

## 2024-02-28 ENCOUNTER — Encounter: Payer: Self-pay | Admitting: Urology

## 2024-02-28 ENCOUNTER — Ambulatory Visit (INDEPENDENT_AMBULATORY_CARE_PROVIDER_SITE_OTHER): Payer: Medicare Other | Admitting: Urology

## 2024-02-28 VITALS — BP 130/84 | HR 71 | Ht 70.0 in | Wt 172.0 lb

## 2024-02-28 DIAGNOSIS — N400 Enlarged prostate without lower urinary tract symptoms: Secondary | ICD-10-CM

## 2024-02-28 DIAGNOSIS — N529 Male erectile dysfunction, unspecified: Secondary | ICD-10-CM

## 2024-02-28 DIAGNOSIS — E291 Testicular hypofunction: Secondary | ICD-10-CM

## 2024-02-28 NOTE — Progress Notes (Signed)
 02/28/2024 10:58 AM   Dale [name] 1950/12/19 [id number]  Referring provider: Eartha Gold, MD [address] [place],  [place] [address]  Urological history: 1.  BPH with LU TS - PSA (02/2024) 2.7 - UroLift (2022)   2.  Hypogonadism - Testosterone  level (02/2024) 773 - Hemoglobin/hematocrit (02/2024) 15.5/47.1 - T cream 20%, 200 mg/mL, 2 mL applied to skin once daily  3. ED - Trimix (30/1/10) 0.4 cc prn 15 minutes prior to intercourse   4. Elevated PSA - per patient, unknown value, negative biopsy   Chief Complaint  Patient presents with   Follow-up    HPI: Dale [name] is a 73 y.o. man who presents today for 6 month follow up.   Previous records reviewed.   I PSS 12/1  He has nocturia x 1-2.  Patient denies any modifying or aggravating factors.  Patient denies any recent UTI's, gross hematuria, dysuria or suprapubic/flank pain.  Patient denies any fevers, chills, nausea or vomiting.     IPSS     Row Name 02/28/24 0900         International Prostate Symptom Score   How often have you had the sensation of not emptying your bladder? Less than half the time     How often have you had to urinate less than every two hours? About half the time     How often have you found you stopped and started again several times when you urinated? Less than 1 in 5 times     How often have you found it difficult to postpone urination? Less than half the time     How often have you had a weak urinary stream? Less than 1 in 5 times     How often have you had to strain to start urination? Less than 1 in 5 times     How many times did you typically get up at night to urinate? 2 Times     Total IPSS Score 12       Quality of Life due to urinary symptoms   If you were to spend the rest of your life with your urinary condition just the way it is now how would you feel about that? Pleased              Score:  1-7 Mild 8-19 Moderate 20-35 Severe     SHIM 13  Satisfactory erections with Trimix and tadalafil  10 mg on demand dosing.     SHIM     Row Name 02/28/24 0908         SHIM: Over the last 6 months:   How do you rate your confidence that you could get and keep an erection? Low     When you had erections with sexual stimulation, how often were your erections hard enough for penetration (entering your partner)? Sometimes (about half the time)     During sexual intercourse, how often were you able to maintain your erection after you had penetrated (entered) your partner? A Few Times (much less than half the time)     During sexual intercourse, how difficult was it to maintain your erection to completion of intercourse? Difficult     When you attempted sexual intercourse, how often was it satisfactory for you? Sometimes (about half the time)       SHIM Total Score   SHIM 13              Score: 1-7 Severe  ED 8-11 Moderate ED 12-16 Mild-Moderate ED 17-21 Mild ED 22-25 No ED    PMH: Past Medical History:  Diagnosis Date   Anginal pain (HCC)    Basal cell carcinoma of skin of other parts of face 01/24/2013   COVID-19    GERD (gastroesophageal reflux disease)    Multilevel degenerative disc disease    L2,3,4 and Cervical   Mycoplasma pneumonia    patient highly susceptible to URI's   Parkinson's disease (HCC)    mild    Surgical History: Past Surgical History:  Procedure Laterality Date   COLONOSCOPY N/A 02/26/2024   Procedure: COLONOSCOPY;  Surgeon: Marnee Sink, MD;  Location: [facility] END[facility];  Service: Endoscopy;  Laterality: N/A;   COLONOSCOPY WITH PROPOFOL  N/A 08/16/2018   Procedure: COLONOSCOPY WITH BIOPSY;  Surgeon: Marnee Sink, MD;  Location: [facility];  Service: Endoscopy;  Laterality: N/A;   CYSTOSCOPY WITH INSERTION OF UROLIFT N/A 04/29/2021   Procedure: CYSTOSCOPY WITH INSERTION OF UROLIFT;  Surgeon: Rea Cambridge, MD;  Location: [facility] ORS;  Service: Urology;  Laterality: N/A;    KNEE ARTHROSCOPY Left 06/12/2019   Procedure: ARTHROSCOPY KNEE partial medial lateral menisectomy;  Surgeon: Arlyne Lame, MD;  Location: [facility] ORS;  Service: Orthopedics;  Laterality: Left;   POLYPECTOMY N/A 08/16/2018   Procedure: POLYPECTOMY;  Surgeon: Marnee Sink, MD;  Location: [facility];  Service: Endoscopy;  Laterality: N/A;   SHOULDER ARTHROSCOPY Bilateral    TONSILLECTOMY      Home Medications:  Allergies as of 02/28/2024       Reactions   Other Other (See Comments)        Medication List        Accurate as of Feb 28, 2024 10:58 AM. If you have any questions, ask your nurse or doctor.          aspirin  EC 81 MG tablet Take 81 mg by mouth daily. Swallow whole.   carbidopa-levodopa 25-100 MG tablet Commonly known as: SINEMET IR Take 1 tablet by mouth 4 (four) times daily.   Crestor 5 MG tablet Generic drug: rosuvastatin Take 5 mg by mouth daily.   omeprazole 20 MG capsule Commonly known as: PRILOSEC Take 20 mg by mouth every morning.   tadalafil  10 MG tablet Commonly known as: Cialis  Take 1 tablet (10 mg total) by mouth daily as needed for erectile dysfunction.   Testosterone  20 % Crea Apply 1 mL topically in the morning and at bedtime. To back of knee        Allergies:  Allergies  Allergen Reactions   Other Other (See Comments)    Family History: No family history on file.  Social History:  reports that he has never smoked. He has never used smokeless tobacco. He reports current alcohol use of about 6.0 standard drinks of alcohol per week. He reports that he does not use drugs.  ROS: Pertinent ROS in HPI  Physical Exam: BP 130/84   Pulse 71   Ht 5\' 10"  (1.778 m)   Wt 172 lb (78 kg)   BMI 24.68 kg/m   Constitutional:  Well nourished. Alert and oriented, No acute distress. HEENT: [place] AT, moist mucus membranes.  Trachea midline Cardiovascular: No clubbing, cyanosis, or edema. Respiratory: Normal respiratory effort, no  increased work of breathing. Neurologic: Grossly intact, no focal deficits, moving all 4 extremities. Psychiatric: Normal mood and affect.  Laboratory Data: Results for orders placed or performed in visit on 02/21/24  Testosterone    Collection Time: 02/21/24  8:06 AM  Result Value Ref Range   Testosterone  773 264 - 916 ng/dL  PSA   Collection Time: 02/21/24  8:06 AM  Result Value Ref Range   Prostate Specific Ag, Serum 2.7 0.0 - 4.0 ng/mL  Hemoglobin and Hematocrit, Blood   Collection Time: 02/21/24  8:06 AM  Result Value Ref Range   Hemoglobin 15.5 13.0 - 17.7 g/dL   Hematocrit 16.1 09.6 - 51.0 %  I have reviewed the labs.   Pertinent Imaging: N/A  Assessment & Plan:    1. Hypogonadism  -testosterone  levels are therapeutic -H & H normal  -continue T cream 20%, 200 mg/mL, 2 mL applied to skin once daily, refill verbally sent to pharmacy  2. BPH with LUTS -PSA stable -continue conservative management, avoiding bladder irritants and timed voiding's  3. Erectile dysfunction:    - at goal with Trimix and tadalafil  10 mg, on-demand-dosing - continue Trimix and tadalafil       Return in about 6 months (around 08/30/2024) for PSA, testosterone , H & H, IPSS, SHIM and exam.  These notes generated with voice recognition software. I apologize for typographical errors.  Briant Camper  [facility] [address]  [address] [place], [place] [address] [phone]

## 2024-08-21 ENCOUNTER — Other Ambulatory Visit: Payer: Self-pay

## 2024-08-21 ENCOUNTER — Other Ambulatory Visit

## 2024-08-21 DIAGNOSIS — E291 Testicular hypofunction: Secondary | ICD-10-CM

## 2024-08-21 DIAGNOSIS — N400 Enlarged prostate without lower urinary tract symptoms: Secondary | ICD-10-CM

## 2024-08-22 LAB — HEMOGLOBIN AND HEMATOCRIT, BLOOD
Hematocrit: 46.7 % (ref 37.5–51.0)
Hemoglobin: 15.5 g/dL (ref 13.0–17.7)

## 2024-08-22 LAB — TESTOSTERONE: Testosterone: 764 ng/dL (ref 264–916)

## 2024-08-22 LAB — PSA: Prostate Specific Ag, Serum: 2.3 ng/mL (ref 0.0–4.0)

## 2024-08-26 NOTE — Progress Notes (Signed)
 08/28/2024 3:04 PM   Dale [name] Feb 11, 1951 [id number]  Referring provider: Epifanio Dale SQUIBB, MD [address] [place],  [place] [address]  Urological history: 1.  BPH with LU TS - PSA (08/2024) 2.3 - UroLift (2022)   2.  Hypogonadism - Testosterone  level (08/2024) 764 - Hemoglobin/hematocrit (08/2024) 15.5/46.7 - T cream 20%, 200 mg/mL, 2 mL applied to skin once daily  3. ED - failed PDE5i's  - Trimix (30/1/10) 0.4 cc prn 15 minutes prior to intercourse   4. Elevated PSA - per patient, unknown value, negative biopsy   Chief Complaint  Patient presents with   Benign Prostatic Hypertrophy   BPH with LUTS   HPI: Dale [name] is a 73 y.o. man who presents today for 6 month follow up.   Previous records reviewed.   He reports good adherence to T cream 20%, 200 mg/mL, 2 mL applied to skin once daily.   Denies new complaints of low libido, erectile dysfunction, fatigue, or mood changes.  No complaints of gynecomastia, visual changes, or thromboembolic symptoms.  Energy level, libido and overall sense of wellbeing being reported as stable/ improved compared to prior visit.    Testosterone  level 764  Hemoglobin/hematocrit 15.5/46.7  I PSS 13/2  He reports sensation of incomplete bladder emptying, urinary frequency, urinary intermittency, urinary urgency, a weak urinary stream, having to strain to void, and nocturia x 2.  Patient denies any modifying or aggravating factors.  Patient denies any recent UTI's, gross hematuria, dysuria or suprapubic/flank pain.  Patient denies any fevers, chills, nausea or vomiting.    PSA 2.3  Serum creatinine (07/2024) 0.9, eGFR 90  SHIM 14  He does not have confidence that he could get and keep an erection, his erections are not firm enough for penetrative intercourse, he has difficulty maintaining his erections, and he is not finding intercourse satisfactory for him.  He is using the Trimix and tadalafil  10 mg,  on-demand-dosing.   Testosterone  level 764  PMH: Past Medical History:  Diagnosis Date   Anginal pain    Basal cell carcinoma of skin of other parts of face 01/24/2013   COVID-19    GERD (gastroesophageal reflux disease)    Multilevel degenerative disc disease    L2,3,4 and Cervical   Mycoplasma pneumonia    patient highly susceptible to URI's   Parkinson's disease (HCC)    mild    Surgical History: Past Surgical History:  Procedure Laterality Date   COLONOSCOPY N/A 02/26/2024   Procedure: COLONOSCOPY;  Surgeon: Jinny Carmine, MD;  Location: [facility] ENDOSCOPY;  Service: Endoscopy;  Laterality: N/A;   COLONOSCOPY WITH PROPOFOL  N/A 08/16/2018   Procedure: COLONOSCOPY WITH BIOPSY;  Surgeon: Jinny Carmine, MD;  Location: [facility];  Service: Endoscopy;  Laterality: N/A;   CYSTOSCOPY WITH INSERTION OF UROLIFT N/A 04/29/2021   Procedure: CYSTOSCOPY WITH INSERTION OF UROLIFT;  Surgeon: Kassie Ozell SAUNDERS, MD;  Location: [facility] ORS;  Service: Urology;  Laterality: N/A;   KNEE ARTHROSCOPY Left 06/12/2019   Procedure: ARTHROSCOPY KNEE partial medial lateral menisectomy;  Surgeon: Mardee Lynwood SQUIBB, MD;  Location: [facility] ORS;  Service: Orthopedics;  Laterality: Left;   POLYPECTOMY N/A 08/16/2018   Procedure: POLYPECTOMY;  Surgeon: Jinny Carmine, MD;  Location: [facility];  Service: Endoscopy;  Laterality: N/A;   SHOULDER ARTHROSCOPY Bilateral    TONSILLECTOMY      Home Medications:  Allergies as of 08/28/2024       Reactions   Other Other (See Comments)  Medication List        Accurate as of August 28, 2024 11:59 PM. If you have any questions, ask your nurse or doctor.          aspirin  EC 81 MG tablet Take 81 mg by mouth daily. Swallow whole.   atorvastatin 10 MG tablet Commonly known as: LIPITOR   carbidopa-levodopa 25-100 MG tablet Commonly known as: SINEMET IR Take 1 tablet by mouth 4 (four) times daily.   Crestor 5 MG tablet Generic drug:  rosuvastatin Take 5 mg by mouth daily.   INBRIJA IN   omeprazole 20 MG capsule Commonly known as: PRILOSEC Take 20 mg by mouth every morning.   PSYLLIUM HUSK PO   sildenafil 20 MG tablet Commonly known as: REVATIO Take 20 mg by mouth.   tadalafil  10 MG tablet Commonly known as: Cialis  Take 1 tablet (10 mg total) by mouth daily as needed for erectile dysfunction. What changed: Another medication with the same name was added. Make sure you understand how and when to take each. Changed by: CLOTILDA CORNWALL   tadalafil  10 MG tablet Commonly known as: Cialis  Take 1 tablet (10 mg total) by mouth daily as needed for erectile dysfunction. What changed: You were already taking a medication with the same name, and this prescription was added. Make sure you understand how and when to take each. Changed by: CLOTILDA CORNWALL   tamsulosin 0.4 MG Caps capsule Commonly known as: FLOMAX Take 0.4 mg by mouth.   Testosterone  20 % Crea Apply 1 mL topically in the morning and at bedtime. To back of knee        Allergies:  Allergies  Allergen Reactions   Other Other (See Comments)    Family History: History reviewed. No pertinent family history.  Social History:  reports that he has never smoked. He has never used smokeless tobacco. He reports current alcohol use of about 6.0 standard drinks of alcohol per week. He reports that he does not use drugs.  ROS: Pertinent ROS in HPI  Physical Exam: BP 122/79   Pulse 75   Ht 5' 10 (1.778 m)   Wt 172 lb (78 kg)   SpO2 98%   BMI 24.68 kg/m   Constitutional:  Well nourished. Alert and oriented, No acute distress. HEENT: [place] AT, moist mucus membranes.  Trachea midline Cardiovascular: No clubbing, cyanosis, or edema. Respiratory: Normal respiratory effort, no increased work of breathing. Neurologic: Grossly intact, no focal deficits, moving all 4 extremities. Psychiatric: Normal mood and affect.   Laboratory Data: See Epic and HPI    I have reviewed the labs.   Pertinent Imaging: N/A  Assessment & Plan:    1. Hypogonadism  -testosterone  levels are therapeutic -H & H normal  -continue T cream 20%, 200 mg/mL, 2 mL applied to skin once daily  2. BPH with LUTS -PSA stable -continue conservative management, avoiding bladder irritants and timed voiding's  3. Erectile dysfunction:    - at goal with Trimix and tadalafil  10 mg, on-demand-dosing - continue Trimix and tadalafil   - tadalafil  sent to [facility] pharmacy   He is interested to see if [facility] can give him his T cream and Trimix at a cheaper rate.  He will go home and make some calls and then let me know what pharmacy is he would like to send these refills to.  Return in about 6 months (around 02/25/2025) for PSA, testosterone , H & H, PSA , IPSS, SHIM .  These  notes generated with voice recognition software. I apologize for typographical errors.  CLOTILDA HELON RIGGERS  [facility] [address]  [address] [place], [place] [address] [phone]

## 2024-08-28 ENCOUNTER — Ambulatory Visit: Payer: Self-pay | Admitting: Urology

## 2024-08-28 ENCOUNTER — Encounter: Payer: Self-pay | Admitting: Urology

## 2024-08-28 VITALS — BP 122/79 | HR 75 | Ht 70.0 in | Wt 172.0 lb

## 2024-08-28 DIAGNOSIS — N400 Enlarged prostate without lower urinary tract symptoms: Secondary | ICD-10-CM | POA: Diagnosis not present

## 2024-08-28 DIAGNOSIS — E291 Testicular hypofunction: Secondary | ICD-10-CM

## 2024-08-28 DIAGNOSIS — N529 Male erectile dysfunction, unspecified: Secondary | ICD-10-CM

## 2024-08-28 MED ORDER — TADALAFIL 10 MG PO TABS: 10.0000 mg | ORAL_TABLET | Freq: Every day | ORAL | 3 refills | Status: AC | PRN

## 2024-09-17 ENCOUNTER — Ambulatory Visit (INDEPENDENT_AMBULATORY_CARE_PROVIDER_SITE_OTHER): Payer: Self-pay | Admitting: Vascular Surgery

## 2024-09-17 ENCOUNTER — Encounter (INDEPENDENT_AMBULATORY_CARE_PROVIDER_SITE_OTHER): Payer: Self-pay | Admitting: Vascular Surgery

## 2024-09-17 VITALS — BP 120/76 | HR 72 | Resp 17 | Ht 70.0 in | Wt 179.4 lb

## 2024-09-17 DIAGNOSIS — E785 Hyperlipidemia, unspecified: Secondary | ICD-10-CM | POA: Diagnosis not present

## 2024-09-17 NOTE — Assessment & Plan Note (Signed)
 lipid control important in reducing the progression of atherosclerotic disease. Continue statin therapy

## 2024-09-17 NOTE — Progress Notes (Signed)
 Patient ID: Dale [name], male   DOB: July 02, 1951, 73 y.o.   MRN: [id number]  Chief Complaint  Patient presents with   Establish Care    New patient carotid done 08/28/24 @ [facility] bilateral carotid stenosis ref. Epifanio Leer Patient (Initial Visit)    np. consult. Carotid done 08/28/24 at [facility]. Bilat Carotid artery stenosis  ref: Fitzgerald,Brae    HPI Dale [name] is a 73 y.o. male.   Discussed the use of AI scribe software for clinical note transcription with the patient, who gave verbal consent to proceed.  History of Present Illness Dale [name] is a 73 year old male who presents for evaluation of carotid artery disease. He was referred by Dr. Epifanio for evaluation of carotid artery disease.  He has arrhythmias managed with cardiac monitoring. Carotid ultrasound showed bilateral plaque with velocities at the borderline between 1-39% and 40-59% stenosis by my interpretation.  The official interpretation was a 50 to 75% stenosis, but the velocities really are not that high in my opinion. He has a family history of heart disease, with his father having undergone bypass surgery. He takes cholesterol medication, monitors his blood pressure, exercises, maintains his weight, and does not use tobacco. He has no symptoms of stroke or TIA, including facial droop, speech difficulty, or transient vision loss.    Results RADIOLOGY Carotid Ultrasound: Velocities mildly elevated, indicating borderline stenosis between 1-39% or 40-59%. Peak systolic velocities are in the range of 120s-130s  Past Medical History:  Diagnosis Date   Anginal pain    Basal cell carcinoma of skin of other parts of face 01/24/2013   COVID-19    GERD (gastroesophageal reflux disease)    Multilevel degenerative disc disease    L2,3,4 and Cervical   Mycoplasma pneumonia    patient highly susceptible to URI's   Parkinson's disease (HCC)    mild    Past Surgical History:  Procedure  Laterality Date   COLONOSCOPY N/A 02/26/2024   Procedure: COLONOSCOPY;  Surgeon: Jinny Carmine, MD;  Location: [facility] ENDOSCOPY;  Service: Endoscopy;  Laterality: N/A;   COLONOSCOPY WITH PROPOFOL  N/A 08/16/2018   Procedure: COLONOSCOPY WITH BIOPSY;  Surgeon: Jinny Carmine, MD;  Location: [facility];  Service: Endoscopy;  Laterality: N/A;   CYSTOSCOPY WITH INSERTION OF UROLIFT N/A 04/29/2021   Procedure: CYSTOSCOPY WITH INSERTION OF UROLIFT;  Surgeon: Kassie Ozell SAUNDERS, MD;  Location: [facility] ORS;  Service: Urology;  Laterality: N/A;   KNEE ARTHROSCOPY Left 06/12/2019   Procedure: ARTHROSCOPY KNEE partial medial lateral menisectomy;  Surgeon: Mardee Lynwood SQUIBB, MD;  Location: [facility] ORS;  Service: Orthopedics;  Laterality: Left;   POLYPECTOMY N/A 08/16/2018   Procedure: POLYPECTOMY;  Surgeon: Jinny Carmine, MD;  Location: [facility];  Service: Endoscopy;  Laterality: N/A;   SHOULDER ARTHROSCOPY Bilateral    TONSILLECTOMY       No family history on file.    Social History   Tobacco Use   Smoking status: Never   Smokeless tobacco: Never  Vaping Use   Vaping status: Never Used  Substance Use Topics   Alcohol use: Yes    Alcohol/week: 6.0 standard drinks of alcohol    Types: 6 Glasses of wine per week    Comment:     Drug use: No     Allergies  Allergen Reactions   Other Other (See Comments)    Current Outpatient Medications  Medication Sig Dispense Refill   aspirin  EC 81 MG tablet Take 81  mg by mouth daily. Swallow whole.     atorvastatin (LIPITOR) 10 MG tablet      Calcium Acetate 667 MG TABS Take 500 mg of amoxicillin by mouth 1 day or 1 dose.     carbidopa-levodopa (SINEMET IR) 25-100 MG tablet Take 1 tablet by mouth 4 (four) times daily.     CRESTOR 5 MG tablet Take 5 mg by mouth daily.     Levodopa (INBRIJA IN)      omeprazole (PRILOSEC) 20 MG capsule Take 20 mg by mouth every morning.     PSYLLIUM HUSK PO      tadalafil  (CIALIS ) 10 MG tablet Take 1 tablet (10  mg total) by mouth daily as needed for erectile dysfunction. 30 tablet 0   tadalafil  (CIALIS ) 10 MG tablet Take 1 tablet (10 mg total) by mouth daily as needed for erectile dysfunction. 90 tablet 3   Testosterone  20 % CREA Apply 1 mL topically in the morning and at bedtime. To back of knee     sildenafil (REVATIO) 20 MG tablet Take 20 mg by mouth. (Patient not taking: Reported on 09/17/2024)     tamsulosin (FLOMAX) 0.4 MG CAPS capsule Take 0.4 mg by mouth. (Patient not taking: Reported on 09/17/2024)     No current facility-administered medications for this visit.      REVIEW OF SYSTEMS (Negative unless checked)  Constitutional: [] Weight loss  [] Fever  [] Chills Cardiac: [] Chest pain   [] Chest pressure   [] Palpitations   [] Shortness of breath when laying flat   [] Shortness of breath at rest   [] Shortness of breath with exertion. Vascular:  [] Pain in legs with walking   [] Pain in legs at rest   [] Pain in legs when laying flat   [] Claudication   [] Pain in feet when walking  [] Pain in feet at rest  [] Pain in feet when laying flat   [] History of DVT   [] Phlebitis   [] Swelling in legs   [] Varicose veins   [] Non-healing ulcers Pulmonary:   [] Uses home oxygen   [] Productive cough   [] Hemoptysis   [] Wheeze  [] COPD   [] Asthma Neurologic:  [] Dizziness  [] Blackouts   [] Seizures   [] History of stroke   [] History of TIA  [] Aphasia   [] Temporary blindness   [] Dysphagia   [] Weakness or numbness in arms   [] Weakness or numbness in legs Musculoskeletal:  [] Arthritis   [] Joint swelling   [] Joint pain   [] Low back pain Hematologic:  [] Easy bruising  [] Easy bleeding   [] Hypercoagulable state   [] Anemic  [] Hepatitis Gastrointestinal:  [] Blood in stool   [] Vomiting blood  [x] Gastroesophageal reflux/heartburn   [] Abdominal pain Genitourinary:  [] Chronic kidney disease   [] Difficult urination  [] Frequent urination  [] Burning with urination   [] Hematuria Skin:  [] Rashes   [] Ulcers   [] Wounds Psychological:  [] History of  anxiety   []  History of major depression.    Physical Exam BP 120/76   Pulse 72   Resp 17   Ht 5' 10 (1.778 m)   Wt 179 lb 6.4 oz (81.4 kg)   BMI 25.74 kg/m  Gen:  WD/WN, NAD. Appears younger than stated age. Head: [place]/AT, No temporalis wasting.  Ear/Nose/Throat: Hearing grossly intact, nares w/o erythema or drainage, oropharynx w/o Erythema/Exudate Eyes: Conjunctiva clear, sclera non-icteric  Neck: trachea midline.  No JVD.  Pulmonary:  Good air movement, respirations not labored, no use of accessory muscles  Cardiac: RRR, no JVD Vascular:  Vessel Right Left  Radial Palpable Palpable  Gastrointestinal:. No masses, surgical incisions, or scars. Musculoskeletal: M/S 5/5 throughout.  Extremities without ischemic changes.  No deformity or atrophy. No edema. Neurologic: Sensation grossly intact in extremities.  Symmetrical.  Speech is fluent. Motor exam as listed above. Psychiatric: Judgment intact, Mood & affect appropriate for pt's clinical situation. Dermatologic: No rashes or ulcers noted.  No cellulitis or open wounds.  Physical Exam   Radiology No results found.  Labs Recent Results (from the past 2160 hours)  PSA     Status: None   Collection Time: 08/21/24  8:52 AM  Result Value Ref Range   Prostate Specific Ag, Serum 2.3 0.0 - 4.0 ng/mL    Comment: Roche ECLIA methodology. According to the American Urological Association, Serum PSA should decrease and remain at undetectable levels after radical prostatectomy. The AUA defines biochemical recurrence as an initial PSA value 0.2 ng/mL or greater followed by a subsequent confirmatory PSA value 0.2 ng/mL or greater. Values obtained with different assay methods or kits cannot be used interchangeably. Results cannot be interpreted as absolute evidence of the presence or absence of malignant disease.   Testosterone      Status: None   Collection Time: 08/21/24  8:52 AM  Result  Value Ref Range   Testosterone  764 264 - 916 ng/dL    Comment: Adult male reference interval is based on a population of healthy nonobese males (BMI <30) between 89 and 45 years old. Travison, et.al. JCEM [id number]. PMID: 71675896.   Hemoglobin and Hematocrit, Blood     Status: None   Collection Time: 08/21/24  8:52 AM  Result Value Ref Range   Hemoglobin 15.5 13.0 - 17.7 g/dL   Hematocrit 53.2 62.4 - 51.0 %    Assessment/Plan: Assessment & Plan Bilateral carotid artery stenosis Mildly elevated velocities, stenosis in the upper end of the 1-39% range or the bottom of the 40-59% range. No intervention needed. Discussed stenting  or surgical benefits for stenosis >70% and risks of stroke with medical management vs. surgical intervention. - Continue annual ultrasound monitoring. - Maintain lifestyle modifications: exercise, weight management, medication adherence.  Hyperlipidemia lipid control important in reducing the progression of atherosclerotic disease. Continue statin therapy     Selinda Gu 09/17/2024, 3:58 PM   This note was created with Dragon medical transcription system.  Any errors from dictation are unintentional.

## 2025-02-18 ENCOUNTER — Other Ambulatory Visit

## 2025-02-25 ENCOUNTER — Ambulatory Visit: Admitting: Urology

## 2025-09-16 ENCOUNTER — Encounter (INDEPENDENT_AMBULATORY_CARE_PROVIDER_SITE_OTHER)

## 2025-09-16 ENCOUNTER — Ambulatory Visit (INDEPENDENT_AMBULATORY_CARE_PROVIDER_SITE_OTHER): Admitting: Vascular Surgery
